# Patient Record
Sex: Male | Born: 1970 | Race: Black or African American | Hispanic: No | Marital: Married | Smoking: Never smoker
Health system: Southern US, Community
[De-identification: ages and names within clinical notes are randomized; demographics above are authoritative.]

## PROBLEM LIST (undated history)

## (undated) DIAGNOSIS — A048 Other specified bacterial intestinal infections: Secondary | ICD-10-CM

## (undated) DIAGNOSIS — M549 Dorsalgia, unspecified: Secondary | ICD-10-CM

## (undated) HISTORY — DX: Dorsalgia, unspecified: M54.9

---
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
## 2004-07-27 ENCOUNTER — Emergency Department (HOSPITAL_COMMUNITY): Admission: EM | Admit: 2004-07-27 | Discharge: 2004-07-28 | Payer: Self-pay | Admitting: Emergency Medicine

## 2006-04-30 ENCOUNTER — Emergency Department (HOSPITAL_COMMUNITY): Admission: EM | Admit: 2006-04-30 | Discharge: 2006-04-30 | Payer: Self-pay | Admitting: Family Medicine

## 2013-06-29 ENCOUNTER — Encounter (HOSPITAL_COMMUNITY): Payer: Self-pay | Admitting: Emergency Medicine

## 2013-06-29 ENCOUNTER — Emergency Department (HOSPITAL_COMMUNITY): Payer: Commercial Managed Care - PPO

## 2013-06-29 ENCOUNTER — Emergency Department (HOSPITAL_COMMUNITY)
Admission: EM | Admit: 2013-06-29 | Discharge: 2013-06-29 | Disposition: A | Discharge status: Home / Self Care | Payer: Commercial Managed Care - PPO | Attending: Emergency Medicine | Admitting: Emergency Medicine

## 2013-06-29 DIAGNOSIS — M25475 Effusion, left foot: Secondary | ICD-10-CM

## 2013-06-29 DIAGNOSIS — M25473 Effusion, unspecified ankle: Secondary | ICD-10-CM | POA: Insufficient documentation

## 2013-06-29 DIAGNOSIS — M25579 Pain in unspecified ankle and joints of unspecified foot: Secondary | ICD-10-CM | POA: Insufficient documentation

## 2013-06-29 DIAGNOSIS — M25476 Effusion, unspecified foot: Secondary | ICD-10-CM | POA: Insufficient documentation

## 2013-06-29 NOTE — ED Provider Notes (Signed)
CSN: [id number]     Arrival date & time 06/29/13  1432 History  This chart was scribed for Junious Silk, PA-C, working with Flint Melter, MD by Blanchard Kelch, ED Scribe. This patient was seen in room TR05C/TR05C and the patient's care was started at 3:15 PM.     Chief Complaint  Patient presents with  . Foot Pain    Patient is a 42 y.o. male presenting with lower extremity pain. The history is provided by the patient. No language interpreter was used.  Foot Pain Pertinent negatives include no chest pain and no shortness of breath.    HPI Comments: Donald Landry is a 43 y.o. male who presents to the Emergency Department complaining of constant left foot pain with associated swelling that began 9am this morning. The pain is worsened with walking. He does not remember any trauma to the area, but thinks he could have come down on his foot wrong due to the nature of his job. He used ibuprofen, ice and changed shoes today at work to alleviate the pain with moderate relief. He denies any calf pain, SOB, or chest pain. He denies recent travel, surgery, history of cancer, or history of DVT in the lungs or extremities.    History reviewed. No pertinent past medical history. History reviewed. No pertinent past surgical history. History reviewed. No pertinent family history. History  Substance Use Topics  . Smoking status: Never Smoker   . Smokeless tobacco: Not on file  . Alcohol Use: Yes    Review of Systems  Respiratory: Negative.  Negative for shortness of breath.   Cardiovascular: Negative for chest pain and leg swelling.  Musculoskeletal: Positive for arthralgias. Negative for myalgias.  All other systems reviewed and are negative.    Allergies  Review of patient's allergies indicates no known allergies.  Home Medications   Current Outpatient Rx  Name  Route  Sig  Dispense  Refill  . ibuprofen (ADVIL,MOTRIN) 200 MG tablet   Oral   Take 400 mg by mouth every 6 (six) hours  as needed.          Triage Vitals: BP 149/81  Pulse 75  Temp(Src) 97.9 F (36.6 C)  Resp 18  Wt 204 lb (92.534 kg)  SpO2 100%  Physical Exam  Nursing note and vitals reviewed. Constitutional: He is oriented to person, place, and time. He appears well-developed and well-nourished. No distress.  HENT:  Head: Normocephalic and atraumatic.  Right Ear: External ear normal.  Left Ear: External ear normal.  Nose: Nose normal.  Eyes: Conjunctivae are normal.  Neck: Normal range of motion. No tracheal deviation present.  Cardiovascular: Normal rate, regular rhythm, normal heart sounds, intact distal pulses and normal pulses.    Capillary refill 3 seconds in all toes. Strong posterior tibial and DP pulses. Homan's sign negative.   Pulmonary/Chest: Effort normal and breath sounds normal. No stridor.  Abdominal: Soft. He exhibits no distension. There is no tenderness.  Musculoskeletal: Normal range of motion. He exhibits edema and tenderness.       Feet:  Cool lower extremities bilaterally. Mild swelling on dorsal aspect of left foot. Compartment soft. Neurovascularly intact.  No calf tenderness.   Neurological: He is alert and oriented to person, place, and time.  Skin: Skin is warm and dry. He is not diaphoretic.  No erythema  Psychiatric: He has a normal mood and affect. His behavior is normal.    ED Course  Procedures (including critical care time)  DIAGNOSTIC STUDIES: Oxygen Saturation is 100% on room air, normal by my interpretation.    COORDINATION OF CARE: 3:47 PM - Patient verbalizes understanding and agrees with treatment plan.    Labs Review Labs Reviewed - No data to display Imaging Review Dg Foot Complete Left  06/29/2013   CLINICAL DATA:  Left foot pain.  EXAM: LEFT FOOT - COMPLETE 3+ VIEW  COMPARISON:  Ankle study 07/28/2004  FINDINGS: Three views of the left foot are negative for an acute fracture or dislocation. No gross soft tissue abnormality. Normal  alignment of the foot.  IMPRESSION: No acute bone abnormality.   Electronically Signed   By: Richarda Overlie M.D.   On: 06/29/2013 15:26    EKG Interpretation   None       MDM   1. Swelling of foot joint, left    Swelling of his left foot on the dorsal aspect. Pain has significantly improved. No concern for DVT. He is Wells criteria negative. No concern for gout. Discussed rest, ice, elevation, compression. Strong distal pulses. Neurovascularly intact. Compartment soft. Gave patient strict return instructions. Vital signs stable for discharge. Patient / Family / Caregiver informed of clinical course, understand medical decision-making process, and agree with plan.   I personally performed the services described in this documentation, which was scribed in my presence. The recorded information has been reviewed and is accurate.     Mora Bellman, PA-C 06/29/13 1556

## 2013-06-29 NOTE — ED Notes (Signed)
Per pt sts he is having left foot pain and swelling. Denies injury. sts possibly shoes he has been wearing.

## 2013-06-30 NOTE — ED Provider Notes (Signed)
Medical screening examination/treatment/procedure(s) were performed by non-physician practitioner and as supervising physician I was immediately available for consultation/collaboration.  Flint Melter, MD 06/30/13 [phone]

## 2014-05-28 ENCOUNTER — Emergency Department (HOSPITAL_COMMUNITY)
Admission: EM | Admit: 2014-05-28 | Discharge: 2014-05-28 | Disposition: A | Discharge status: Home / Self Care | Payer: Commercial Managed Care - PPO | Attending: Emergency Medicine | Admitting: Emergency Medicine

## 2014-05-28 ENCOUNTER — Encounter (HOSPITAL_COMMUNITY): Payer: Self-pay | Admitting: Emergency Medicine

## 2014-05-28 DIAGNOSIS — Z23 Encounter for immunization: Secondary | ICD-10-CM | POA: Diagnosis not present

## 2014-05-28 DIAGNOSIS — S61412A Laceration without foreign body of left hand, initial encounter: Secondary | ICD-10-CM

## 2014-05-28 DIAGNOSIS — Y929 Unspecified place or not applicable: Secondary | ICD-10-CM | POA: Diagnosis not present

## 2014-05-28 DIAGNOSIS — W208XXA Other cause of strike by thrown, projected or falling object, initial encounter: Secondary | ICD-10-CM | POA: Insufficient documentation

## 2014-05-28 DIAGNOSIS — S61402A Unspecified open wound of left hand, initial encounter: Secondary | ICD-10-CM | POA: Insufficient documentation

## 2014-05-28 DIAGNOSIS — Y9389 Activity, other specified: Secondary | ICD-10-CM | POA: Insufficient documentation

## 2014-05-28 MED ORDER — TETANUS-DIPHTH-ACELL PERTUSSIS 5-2.5-18.5 LF-MCG/0.5 IM SUSP
0.5000 mL | Freq: Once | INTRAMUSCULAR | Status: AC
  Administered 2014-05-28: 0.5 mL via INTRAMUSCULAR
  Filled 2014-05-28: qty 0.5

## 2014-05-28 MED ORDER — LIDOCAINE HCL (PF) 1 % IJ SOLN
2.0000 mL | Freq: Once | INTRAMUSCULAR | Status: AC
  Administered 2014-05-28: 2 mL
  Filled 2014-05-28: qty 5

## 2014-05-28 NOTE — ED Provider Notes (Signed)
CSN: [id number]     Arrival date & time 05/28/14  1414 History  This chart was scribed for non-physician practitioner, Johnnette Gourdobyn Albert, PA-C,working with Flint MelterElliott L Wentz, MD, by Karle PlumberJennifer Tensley, ED Scribe. This patient was seen in room TR06C/TR06C and the patient's care was started at 2:41 PM.  Chief Complaint  Patient presents with  . Laceration   Patient is a 43 y.o. male presenting with skin laceration. The history is provided by the patient. No language interpreter was used.  Laceration  HPI Comments:  Donald Landry is a 43 y.o. male who presents to the Emergency Department complaining of a laceration to the dorsal aspect of his left hand that occurred PTA. Pt states the metal frame of a light fixture fell onto his hand while he was trying to take it down. He reports associated bleeding that is now controlled. He has not done anything for the wound since the incident. He denies numbness, tingling or inability to move the finger. He reports his last tetanus vaccination was over ten years ago.   History reviewed. No pertinent past medical history. History reviewed. No pertinent past surgical history. History reviewed. No pertinent family history. History  Substance Use Topics  . Smoking status: Never Smoker   . Smokeless tobacco: Not on file  . Alcohol Use: Yes    Review of Systems  Skin: Positive for wound.  Neurological: Negative for numbness.  All other systems reviewed and are negative.   Allergies  Review of patient's allergies indicates no known allergies.  Home Medications   Prior to Admission medications   Medication Sig Start Date End Date Taking? Authorizing Provider  ibuprofen (ADVIL,MOTRIN) 200 MG tablet Take 400 mg by mouth every 6 (six) hours as needed.    Historical Provider, MD   Triage Vitals: BP 142/71  Pulse 68  Resp 20  SpO2 100% Physical Exam  Nursing note and vitals reviewed. Constitutional: He is oriented to person, place, and time. He appears  well-developed and well-nourished. No distress.  HENT:  Head: Normocephalic and atraumatic.  Eyes: Conjunctivae and EOM are normal.  Neck: Normal range of motion. Neck supple.  Cardiovascular: Normal rate, regular rhythm and normal heart sounds.   Cap refill less than 3 seconds.  Pulmonary/Chest: Effort normal and breath sounds normal.  Musculoskeletal: Normal range of motion. He exhibits no edema.  Neurological: He is alert and oriented to person, place, and time.  Skin: Skin is warm and dry.  2 cm laceration over dorsal aspect of right hand over MCP joint. Full flexion and extension at MCP joint. No erythema or swelling noted.  Psychiatric: He has a normal mood and affect. His behavior is normal.    ED Course  Procedures (including critical care time) DIAGNOSTIC STUDIES: Oxygen Saturation is 100% on RA, normal by my interpretation.   COORDINATION OF CARE: 2:45 PM- Will irrigate wound and suture it. Pt verbalizes understanding and agrees to plan.  LACERATION REPAIR PROCEDURE NOTE The patient's identification was confirmed and consent was obtained. This procedure was performed by Johnnette Gourdobyn Albert, PA-C at 2:57 PM. Site: left dorsal hand Sterile procedures observed Anesthetic used (type and amt): Lidocaine 1% without Epinephrine (2 mLs) Suture type/size: 5-0 Prolene Length: 2 cm # of Sutures: 7 Technique: simple, interrupted Complexity: simple Antibx ointment applied Tetanus ordered Site anesthetized, irrigated with NS, explored without evidence of foreign body, wound well approximated, site covered with dry, sterile dressing.  Patient tolerated procedure well without complications. Instructions for care discussed verbally  and patient provided with additional written instructions for homecare and f/u.  Medications  lidocaine (PF) (XYLOCAINE) 1 % injection 2 mL (2 mLs Infiltration Given 05/28/14 1455)  Tdap (BOOSTRIX) injection 0.5 mL (0.5 mLs Intramuscular Given 05/28/14 1455)     Labs Review Labs Reviewed - No data to display  Imaging Review No results found.   EKG Interpretation None      MDM   Final diagnoses:  Hand laceration, left, initial encounter   Neurovascularly intact no evidence of tendon disruption. Wound care given. Laceration sutured. Tdap updated. Stable for d/c. Return precautions given. Patient states understanding of treatment care plan and is agreeable.  I personally performed the services described in this documentation, which was scribed in my presence. The recorded information has been reviewed and is accurate.    Trevor Mace, PA-C 05/28/14 1530

## 2014-05-28 NOTE — Discharge Instructions (Signed)
Laceration Care, Adult °A laceration is a cut or lesion that goes through all layers of the skin and into the tissue just beneath the skin. °TREATMENT  °Some lacerations may not require closure. Some lacerations may not be able to be closed due to an increased risk of infection. It is important to see your caregiver as soon as possible after an injury to minimize the risk of infection and maximize the opportunity for successful closure. °If closure is appropriate, pain medicines may be given, if needed. The wound will be cleaned to help prevent infection. Your caregiver will use stitches (sutures), staples, wound glue (adhesive), or skin adhesive strips to repair the laceration. These tools bring the skin edges together to allow for faster healing and a better cosmetic outcome. However, all wounds will heal with a scar. Once the wound has healed, scarring can be minimized by covering the wound with sunscreen during the day for 1 full year. °HOME CARE INSTRUCTIONS  °For sutures or staples: °· Keep the wound clean and dry. °· If you were given a bandage (dressing), you should change it at least once a day. Also, change the dressing if it becomes wet or dirty, or as directed by your caregiver. °· Wash the wound with soap and water 2 times a day. Rinse the wound off with water to remove all soap. Pat the wound dry with a clean towel. °· After cleaning, apply a thin layer of the antibiotic ointment as recommended by your caregiver. This will help prevent infection and keep the dressing from sticking. °· You may shower as usual after the first 24 hours. Do not soak the wound in water until the sutures are removed. °· Only take over-the-counter or prescription medicines for pain, discomfort, or fever as directed by your caregiver. °· Get your sutures or staples removed as directed by your caregiver. °For skin adhesive strips: °· Keep the wound clean and dry. °· Do not get the skin adhesive strips wet. You may bathe  carefully, using caution to keep the wound dry. °· If the wound gets wet, pat it dry with a clean towel. °· Skin adhesive strips will fall off on their own. You may trim the strips as the wound heals. Do not remove skin adhesive strips that are still stuck to the wound. They will fall off in time. °For wound adhesive: °· You may briefly wet your wound in the shower or bath. Do not soak or scrub the wound. Do not swim. Avoid periods of heavy perspiration until the skin adhesive has fallen off on its own. After showering or bathing, gently pat the wound dry with a clean towel. °· Do not apply liquid medicine, cream medicine, or ointment medicine to your wound while the skin adhesive is in place. This may loosen the film before your wound is healed. °· If a dressing is placed over the wound, be careful not to apply tape directly over the skin adhesive. This may cause the adhesive to be pulled off before the wound is healed. °· Avoid prolonged exposure to sunlight or tanning lamps while the skin adhesive is in place. Exposure to ultraviolet light in the first year will darken the scar. °· The skin adhesive will usually remain in place for 5 to 10 days, then naturally fall off the skin. Do not pick at the adhesive film. °You may need a tetanus shot if: °· You cannot remember when you had your last tetanus shot. °· You have never had a tetanus   shot. °If you get a tetanus shot, your arm may swell, get red, and feel warm to the touch. This is common and not a problem. If you need a tetanus shot and you choose not to have one, there is a rare chance of getting tetanus. Sickness from tetanus can be serious. °SEEK MEDICAL CARE IF:  °· You have redness, swelling, or increasing pain in the wound. °· You see a red line that goes away from the wound. °· You have yellowish-white fluid (pus) coming from the wound. °· You have a fever. °· You notice a bad smell coming from the wound or dressing. °· Your wound breaks open before or  after sutures have been removed. °· You notice something coming out of the wound such as wood or glass. °· Your wound is on your hand or foot and you cannot move a finger or toe. °SEEK IMMEDIATE MEDICAL CARE IF:  °· Your pain is not controlled with prescribed medicine. °· You have severe swelling around the wound causing pain and numbness or a change in color in your arm, hand, leg, or foot. °· Your wound splits open and starts bleeding. °· You have worsening numbness, weakness, or loss of function of any joint around or beyond the wound. °· You develop painful lumps near the wound or on the skin anywhere on your body. °MAKE SURE YOU:  °· Understand these instructions. °· Will watch your condition. °· Will get help right away if you are not doing well or get worse. °Document Released: 08/11/2005 Document Revised: 11/03/2011 Document Reviewed: 02/04/2011 °[employer]® Patient Information ©2015 [employer]. This information is not intended to replace advice given to you by your health care provider. Make sure you discuss any questions you have with your health care provider. ° °Sutured Wound Care °Sutures are stitches that can be used to close wounds. Wound care helps prevent pain and infection.  °HOME CARE INSTRUCTIONS  °· Rest and elevate the injured area until all the pain and swelling are gone. °· Only take over-the-counter or prescription medicines for pain, discomfort, or fever as directed by your caregiver. °· After 48 hours, gently wash the area with mild soap and water once a day, or as directed. Rinse off the soap. Pat the area dry with a clean towel. Do not rub the wound. This may cause bleeding. °· Follow your caregiver's instructions for how often to change the bandage (dressing). Stop using a dressing after 2 days or after the wound stops draining. °· If the dressing sticks, moisten it with soapy water and gently remove it. °· Apply ointment on the wound as directed. °· Avoid stretching a sutured  wound. °· Drink enough fluids to keep your urine clear or pale yellow. °· Follow up with your caregiver for suture removal as directed. °· Use sunscreen on your wound for the next 3 to 6 months so the scar will not darken. °SEEK IMMEDIATE MEDICAL CARE IF:  °· Your wound becomes red, swollen, hot, or tender. °· You have increasing pain in the wound. °· You have a red streak that extends from the wound. °· There is pus coming from the wound. °· You have a fever. °· You have shaking chills. °· There is a bad smell coming from the wound. °· You have persistent bleeding from the wound. °MAKE SURE YOU:  °· Understand these instructions. °· Will watch your condition. °· Will get help right away if you are not doing well or get worse. °Document Released:   09/18/2004 Document Revised: 11/03/2011 Document Reviewed: 12/15/2010 °ExitCare® Patient Information ©2015 [employer]. This information is not intended to replace advice given to you by your health care provider. Make sure you discuss any questions you have with your health care provider. ° °

## 2014-05-28 NOTE — ED Notes (Signed)
Per pt sts left hand lac from an 8 foot  light. sts happened one hour ago. Bleeding controlled. Pt can move and feel fingers without difficulty.

## 2014-05-28 NOTE — ED Notes (Signed)
Declined W/C at D/C and was escorted to lobby by RN. 

## 2014-05-30 NOTE — ED Provider Notes (Signed)
Medical screening examination/treatment/procedure(s) were performed by non-physician practitioner and as supervising physician I was immediately available for consultation/collaboration.  Flint MelterElliott L Jasslyn Finkel, MD 05/30/14 [phone]

## 2015-05-23 ENCOUNTER — Emergency Department (HOSPITAL_COMMUNITY): Payer: Commercial Managed Care - PPO

## 2015-05-23 ENCOUNTER — Emergency Department (HOSPITAL_COMMUNITY)
Admission: EM | Admit: 2015-05-23 | Discharge: 2015-05-23 | Disposition: A | Discharge status: Home / Self Care | Payer: Commercial Managed Care - PPO | Attending: Emergency Medicine | Admitting: Emergency Medicine

## 2015-05-23 ENCOUNTER — Encounter (HOSPITAL_COMMUNITY): Payer: Self-pay | Admitting: Emergency Medicine

## 2015-05-23 DIAGNOSIS — S41101A Unspecified open wound of right upper arm, initial encounter: Secondary | ICD-10-CM | POA: Diagnosis present

## 2015-05-23 DIAGNOSIS — S51811A Laceration without foreign body of right forearm, initial encounter: Secondary | ICD-10-CM | POA: Diagnosis not present

## 2015-05-23 DIAGNOSIS — S41151A Open bite of right upper arm, initial encounter: Secondary | ICD-10-CM

## 2015-05-23 DIAGNOSIS — Y9389 Activity, other specified: Secondary | ICD-10-CM | POA: Insufficient documentation

## 2015-05-23 DIAGNOSIS — Y9289 Other specified places as the place of occurrence of the external cause: Secondary | ICD-10-CM | POA: Insufficient documentation

## 2015-05-23 DIAGNOSIS — Y998 Other external cause status: Secondary | ICD-10-CM | POA: Diagnosis not present

## 2015-05-23 DIAGNOSIS — W540XXA Bitten by dog, initial encounter: Secondary | ICD-10-CM | POA: Diagnosis not present

## 2015-05-23 MED ORDER — AMOXICILLIN-POT CLAVULANATE 875-125 MG PO TABS
1.0000 | ORAL_TABLET | Freq: Once | ORAL | Status: AC
  Administered 2015-05-23: 1 via ORAL
  Filled 2015-05-23: qty 1

## 2015-05-23 MED ORDER — AMOXICILLIN-POT CLAVULANATE 875-125 MG PO TABS: 1.0000 | ORAL_TABLET | Freq: Two times a day (BID) | ORAL | Status: DC

## 2015-05-23 MED ORDER — LIDOCAINE-PRILOCAINE 2.5-2.5 % EX CREA: TOPICAL_CREAM | Freq: Once | CUTANEOUS | Status: DC

## 2015-05-23 MED ORDER — LIDOCAINE HCL (PF) 1 % IJ SOLN
30.0000 mL | Freq: Once | INTRAMUSCULAR | Status: AC
  Administered 2015-05-23: 30 mL
  Filled 2015-05-23: qty 30

## 2015-05-23 NOTE — Discharge Instructions (Signed)

## 2015-05-23 NOTE — ED Notes (Signed)
Pt reports he was bit by [place] bull dog just pta. Pt with large puncture wound/laceration and 2 smaller puncture wounds to R FA. Bleeding controlled. Dog is up to date on rabies vaccine.

## 2015-05-23 NOTE — ED Provider Notes (Signed)
CSN: [id number]     Arrival date & time 05/23/15  2037 History  By signing my name below, I, Octavia Heir, attest that this documentation has been prepared under the direction and in the presence of Marlon Pel, PA-C. Electronically Signed: Octavia Heir, ED Scribe. 05/23/2015. 9:10 PM.    Chief Complaint  Patient presents with  . Animal Bite      The history is provided by the patient. No language interpreter was used.   HPI Comments: Donald Landry is a 44 y.o. male who presents to the Emergency Department complaining of a sudden onset, gradual worsening animal bit onset 30 minutes PTA. He rates his pain as a current 3/10. Pt has a laceration on the inside of his right forearm and two small puncture wounds on the side. He states the dog clamped down on his arm and then released. Pt is up to date on his tetanus shot. The animal that bit him is up to date on his rabies vaccination, owner of the dog is present. It was a pitbull.  History reviewed. No pertinent past medical history. History reviewed. No pertinent past surgical history. No family history on file. Social History  Substance Use Topics  . Smoking status: Never Smoker   . Smokeless tobacco: None  . Alcohol Use: Yes    Review of Systems  Skin: Positive for wound.  All other systems reviewed and are negative.   Allergies  Review of patient's allergies indicates no known allergies.  Home Medications   Prior to Admission medications   Medication Sig Start Date End Date Taking? Authorizing Provider  amoxicillin-clavulanate (AUGMENTIN) 875-125 MG tablet Take 1 tablet by mouth 2 (two) times daily. 05/23/15   Tiffany Neva Seat, PA-C  ibuprofen (ADVIL,MOTRIN) 200 MG tablet Take 400 mg by mouth every 6 (six) hours as needed.    Historical Provider, MD   Triage vitals: BP 135/78 mmHg  Pulse 110  Temp(Src) 98.6 F (37 C) (Oral)  Resp 18  Ht  (1.676 m)  Wt 218 lb (98.884 kg)  BMI 35.20 kg/m2  SpO2 97% Physical  Exam  Constitutional: He appears well-developed and well-nourished. No distress.  HENT:  Head: Normocephalic and atraumatic.  Eyes: Right eye exhibits no discharge. Left eye exhibits no discharge.  Pulmonary/Chest: Effort normal. No respiratory distress.  Neurological: He is alert. Coordination normal.  Skin: No rash noted. He is not diaphoretic.  Large 3 cm laceration to the anterior portion of the forearm with multiple superficial abrasions and 1 associated puncture wound.  The large laceration is open with fatty tissue exposed. The wound on exploration goes deeper through the subcutaneous fat but does not extend into the musculature.   He has FROM of his elbow, wrist, and all 5 fingers. Intact sensations, not actively bleeding.   Psychiatric: He has a normal mood and affect. His behavior is normal.  Nursing note and vitals reviewed.   ED Course  Procedures  DIAGNOSTIC STUDIES: Oxygen Saturation is 97% on RA, normal by my interpretation.  COORDINATION OF CARE:  9:07 PM Discussed treatment plan which includes x-ray of right forearm, suture area with pt at bedside and pt agreed to plan.  Labs Review Labs Reviewed - No data to display  Imaging Review Dg Forearm Right  05/23/2015   CLINICAL DATA:  Right forearm dog bite.  Initial encounter.  EXAM: RIGHT FOREARM - 2 VIEW  COMPARISON:  None.  FINDINGS: Swelling and skin breech to the ventral proximal forearm. No fracture, dislocation,  or opaque foreign body.  IMPRESSION: Soft tissue injury without osseous abnormality. No opaque foreign body.   Electronically Signed   By: Marnee Spring M.D.   On: 05/23/2015 21:33   I have personally reviewed and evaluated these images and lab results as part of my medical decision-making.   EKG Interpretation None      MDM   Final diagnoses:  Dog bite of arm, right, initial encounter    LACERATION REPAIR Performed by: Dorthula Matas Authorized by: Dorthula Matas Consent: Verbal  consent obtained. Risks and benefits: risks, benefits and alternatives were discussed Consent given by: patient Patient identity confirmed: provided demographic data Prepped and Draped in normal sterile fashion Wound explored Laceration Location: right forearm Laceration Length:  3 cm No Foreign Bodies seen or palpated Anesthesia: local infiltration Local anesthetic: lidocaine 1 % wo epinephrine Anesthetic total: 4 ml Irrigation method: syringe Amount of cleaning: standard Skin closure: sutures Number of sutures: 3 Technique: 3 cm laceration wound edges were loosely approximated. No subcutaneous fat exposed, no bleeding Patient tolerance: Patient tolerated the procedure well with no immediate complications.   Patients wound was numbed and thoroughly irrigated with a L of NS. Due to large gaping wound the 3 cm laceration was then loosely approximated. Pt UTD on tetanus, dog has had rabies vaccination and the owner of the dog is present.  Patient advised to return in 2-3 days for wound recheck and educated on risk of infection despite all measures taken for prevention.  Sutures will most likely s tay in for 10-14 days. Wound education given.  Medications  amoxicillin-clavulanate (AUGMENTIN) 875-125 MG per tablet 1 tablet (not administered)  lidocaine (PF) (XYLOCAINE) 1 % injection 30 mL (30 mLs Other Given 05/23/15 2144)    43 y.o.Donald Landry's evaluation in the Emergency Department is complete. It has been determined that no acute conditions requiring further emergency intervention are present at this time. The patient/guardian have been advised of the diagnosis and plan. We have discussed signs and symptoms that warrant return to the ED, such as changes or worsening in symptoms.  Vital signs are stable at discharge. Filed Vitals:   05/23/15 2042  BP: 135/78  Pulse: 110  Temp: 98.6 F (37 C)  Resp: 18    Patient/guardian has voiced understanding and agreed to follow-up  with the PCP or specialist.  I personally performed the services described in this documentation, which was scribed in my presence. The recorded information has been reviewed and is accurate.       Marlon Pel, PA-C 05/23/15 2228  Lavera Guise, MD 05/24/15 [phone]

## 2016-08-29 IMAGING — DX DG FOREARM 2V*R*
2 series · 2 of 2 positions shown · non-contrast
Comparison: None.

CLINICAL DATA: Right forearm dog bite.  Initial encounter.

EXAM:
RIGHT FOREARM - 2 VIEW

[forearm ap]
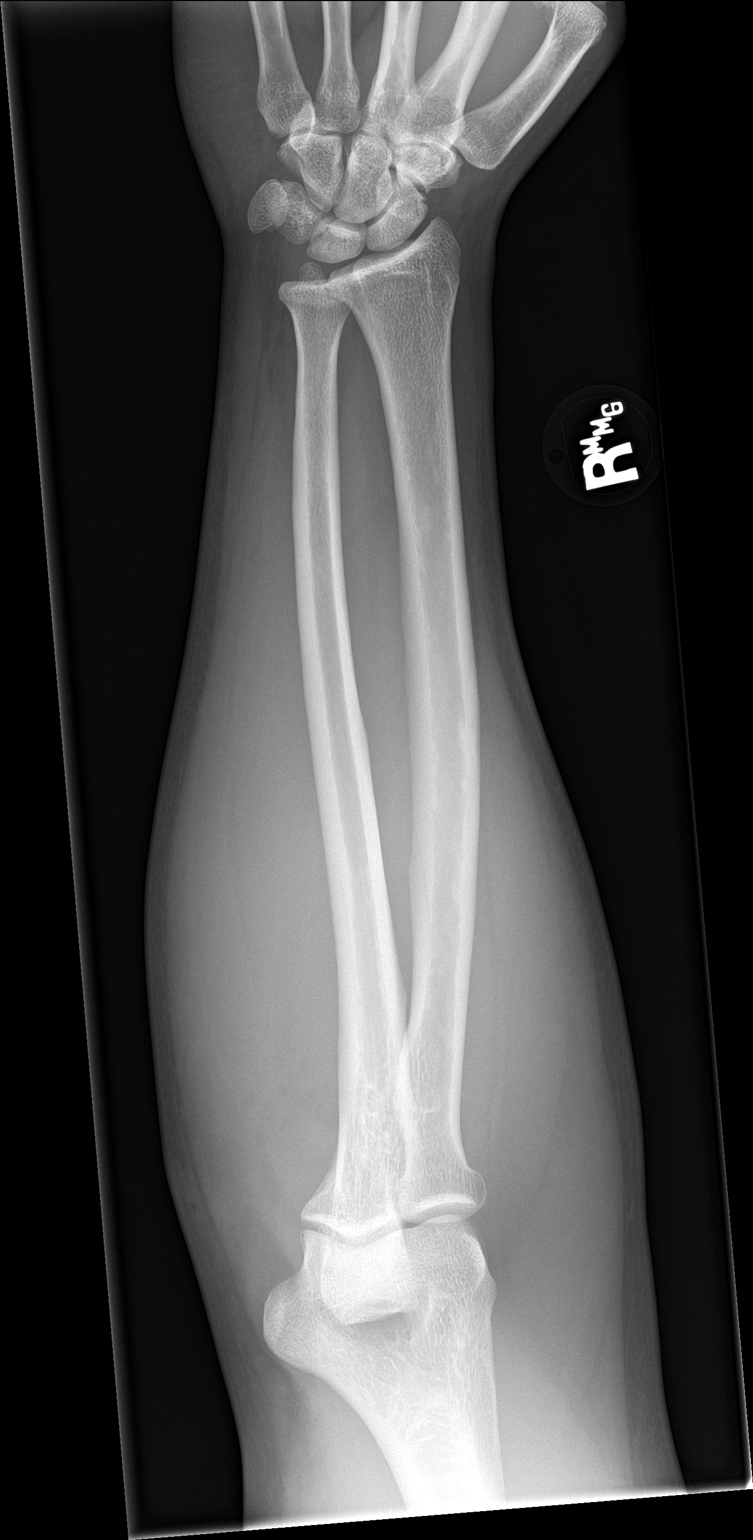

[forearm lat]
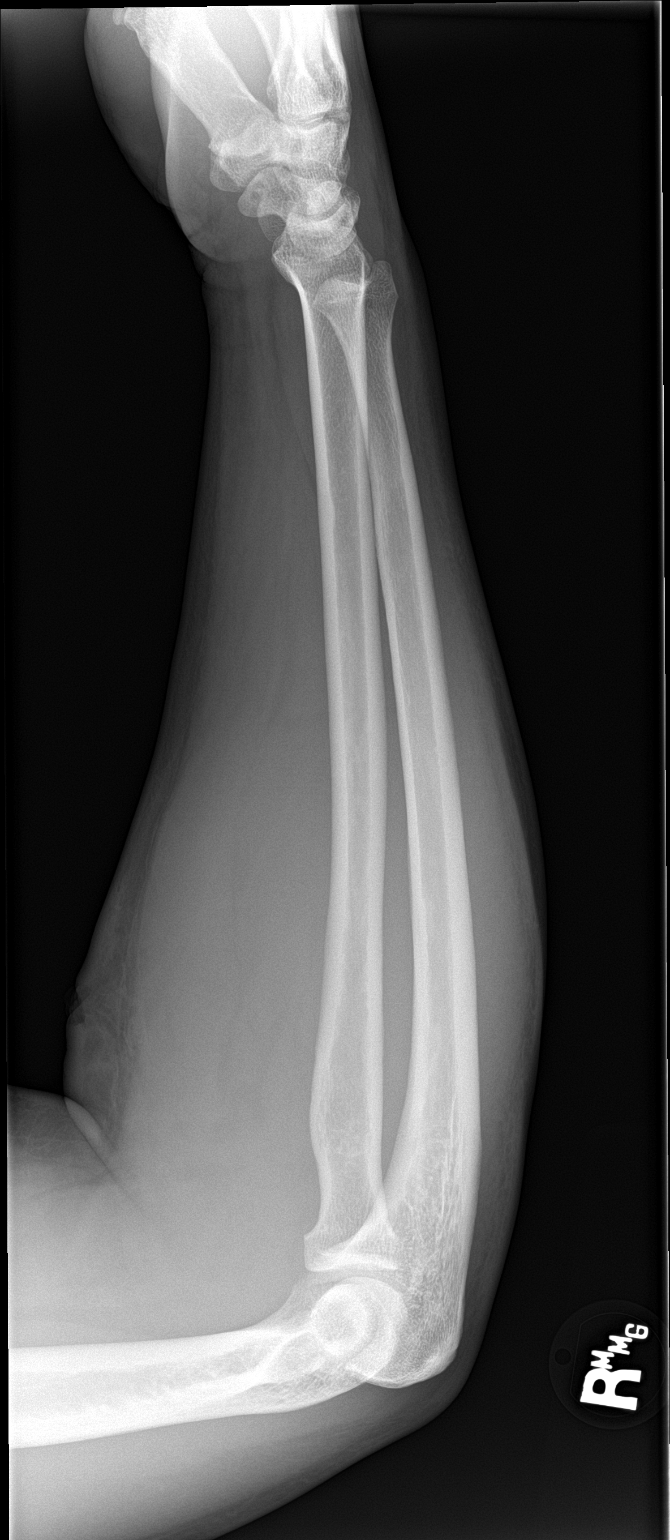

[2 of 2 positions shown; findings below may reference images not displayed]

FINDINGS: Swelling and skin breech to the ventral proximal forearm. No
fracture, dislocation, or opaque foreign body.
IMPRESSION: Soft tissue injury without osseous abnormality. No opaque foreign
body.

## 2018-03-24 ENCOUNTER — Emergency Department (HOSPITAL_COMMUNITY)
Admission: EM | Admit: 2018-03-24 | Discharge: 2018-03-25 | Disposition: A | Discharge status: Home / Self Care | Payer: Commercial Managed Care - PPO | Attending: Emergency Medicine | Admitting: Emergency Medicine

## 2018-03-24 ENCOUNTER — Other Ambulatory Visit: Payer: Self-pay

## 2018-03-24 ENCOUNTER — Encounter (HOSPITAL_COMMUNITY): Payer: Self-pay | Admitting: Emergency Medicine

## 2018-03-24 DIAGNOSIS — L03032 Cellulitis of left toe: Secondary | ICD-10-CM | POA: Insufficient documentation

## 2018-03-24 NOTE — ED Triage Notes (Signed)
Pt complains of L toe swelling and ingrown toenail has been going on since last Thursday.

## 2018-03-25 MED ORDER — OXYCODONE-ACETAMINOPHEN 5-325 MG PO TABS
ORAL_TABLET | ORAL | Status: AC
  Filled 2018-03-25: qty 1

## 2018-03-25 MED ORDER — LIDOCAINE HCL 2 % IJ SOLN
20.0000 mL | Freq: Once | INTRAMUSCULAR | Status: AC
  Administered 2018-03-25: 400 mg
  Filled 2018-03-25: qty 20

## 2018-03-25 MED ORDER — OXYCODONE-ACETAMINOPHEN 5-325 MG PO TABS
1.0000 | ORAL_TABLET | ORAL | Status: DC | PRN
  Administered 2018-03-25: 1 via ORAL

## 2018-03-25 MED ORDER — DOXYCYCLINE HYCLATE 100 MG PO CAPS: 100.0000 mg | ORAL_CAPSULE | Freq: Two times a day (BID) | ORAL | 0 refills | Status: DC

## 2018-03-25 NOTE — ED Notes (Signed)
Patient verbalizes understanding of medications and discharge instructions. No further questions at this time. VSS and patient ambulatory at discharge.   

## 2018-03-25 NOTE — ED Provider Notes (Signed)
[name] EMERGENCY DEPARTMENT Provider Note   CSN: [id number] Arrival date & time: 03/24/18  2335     History   Chief Complaint Chief Complaint  Patient presents with  . Ingrown Toenail    HPI Donald Landry is a 47 y.o. male.  Left great toe pain and swelling for the past 2 days.  Denies injury.  Denies fever.  No bleeding or drainage.  He is not a diabetic.  He is never had this problem before.  The history is provided by the patient.    History reviewed. No pertinent past medical history.  There are no active problems to display for this patient.   History reviewed. No pertinent surgical history.      Home Medications    Prior to Admission medications   Medication Sig Start Date End Date Taking? Authorizing Provider  amoxicillin-clavulanate (AUGMENTIN) 875-125 MG tablet Take 1 tablet by mouth 2 (two) times daily. 05/23/15   Marlon Pel, PA-C  ibuprofen (ADVIL,MOTRIN) 200 MG tablet Take 400 mg by mouth every 6 (six) hours as needed.    [provider]    Family History No family history on file.  Social History Social History   Tobacco Use  . Smoking status: Never Smoker  Substance Use Topics  . Alcohol use: Yes  . Drug use: Not on file     Allergies   Patient has no known allergies.   Review of Systems Review of Systems  Musculoskeletal: Positive for arthralgias and myalgias.    all other systems are negative except as noted in the HPI and PMH.    Physical Exam Updated Vital Signs BP 123/72 (BP Location: Right Arm)   Pulse (!) 55   Temp 98.2 F (36.8 C) (Oral)   Resp 16   Ht 5\' 6"  (1.676 m)   Wt 99.3 kg (219 lb)   SpO2 100%   BMI 35.35 kg/m   Physical Exam  Constitutional: He is oriented to person, place, and time. He appears well-developed and well-nourished. No distress.  HENT:  Head: Normocephalic and atraumatic.  Mouth/Throat: Oropharynx is clear and moist. No oropharyngeal exudate.  Eyes:  Pupils are equal, round, and reactive to light. Conjunctivae and EOM are normal.  Neck: Normal range of motion. Neck supple.  No meningismus.  Cardiovascular: Normal rate, regular rhythm, normal heart sounds and intact distal pulses.  No murmur heard. Pulmonary/Chest: Effort normal and breath sounds normal. No respiratory distress.  Abdominal: Soft. There is no tenderness. There is no rebound and no guarding.  Musculoskeletal: Normal range of motion. He exhibits edema and tenderness.  Left great toe with tenderness and fluctuance along nail fold.  Consistent with paronychia.  Intact DP and PT pulses.  Neurological: He is alert and oriented to person, place, and time. No cranial nerve deficit. He exhibits normal muscle tone. Coordination normal.  No ataxia on finger to nose bilaterally. No pronator drift. 5/5 strength throughout. CN 2-12 intact.Equal grip strength. Sensation intact.   Skin: Skin is warm.  Psychiatric: He has a normal mood and affect. His behavior is normal.  Nursing note and vitals reviewed.    ED Treatments / Results  Labs (all labs ordered are listed, but only abnormal results are displayed) Labs Reviewed - No data to display  EKG None  Radiology No results found.  Procedures .Marland KitchenIncision and Drainage Date/Time: 03/25/2018 4:34 AM Performed by: Glynn Octave, MD Authorized by: Glynn Octave, MD   Consent:    Consent obtained:  Verbal   Consent given by:  Patient   Risks discussed:  Bleeding, incomplete drainage and infection   Alternatives discussed:  No treatment Location:    Type:  Fluid collection   Location:  Lower extremity   Lower extremity location:  Toe   Toe location:  L big toe Pre-procedure details:    Skin preparation:  Betadine Anesthesia (see MAR for exact dosages):    Anesthesia method:  Nerve block   Block needle gauge:  25 G   Block anesthetic:  Lidocaine 1% w/o epi   Block technique:  Digital   Block injection procedure:   Anatomic landmarks identified, introduced needle, incremental injection, negative aspiration for blood and anatomic landmarks palpated   Block outcome:  Incomplete block Procedure details:    Needle aspiration: no     Incision types:  Single straight   Incision depth:  Subcutaneous   Scalpel blade:  11   Wound management:  Probed and deloculated and irrigated with saline   Drainage:  Purulent   Drainage amount:  Copious   Wound treatment:  Wound left open   Packing materials:  None Post-procedure details:    Patient tolerance of procedure:  Tolerated well, no immediate complications   (including critical care time)  Medications Ordered in ED Medications  oxyCODONE-acetaminophen (PERCOCET/ROXICET) 5-325 MG per tablet 1 tablet (1 tablet Oral Given 03/25/18 0131)  oxyCODONE-acetaminophen (PERCOCET/ROXICET) 5-325 MG per tablet (has no administration in time range)  lidocaine (XYLOCAINE) 2 % (with pres) injection 400 mg (has no administration in time range)     Initial Impression / Assessment and Plan / ED Course  I have reviewed the triage vital signs and the nursing notes.  Pertinent labs & imaging results that were available during my care of the patient were reviewed by me and considered in my medical decision making (see chart for details).    Left great toe paronychia.  Possible ingrown toenail as well.  Incision and drainage performed as above.  Patient will be given prophylactic antibiotics, instructed on wound care and warm soaks and referred to podiatry. Return precautions discussed.  Final Clinical Impressions(s) / ED Diagnoses   Final diagnoses:  Paronychia of great toe, left    ED Discharge Orders    None       Laurena Valko, Jeannett SeniorStephen, MD 03/25/18 [phone]

## 2018-03-25 NOTE — Discharge Instructions (Addendum)
Take the antibiotics and perform the warm soaks as described.  Follow-up with Dr. Logan BoresEvans in the foot center.  Return to the ED if develop worsening symptoms.

## 2019-01-24 ENCOUNTER — Telehealth: Payer: Self-pay

## 2019-01-24 NOTE — Telephone Encounter (Signed)
Left a vm for patient to callback and due the screening

## 2019-01-24 NOTE — Telephone Encounter (Signed)
Patient was negative for the cov-19 screening. Patient will be coming in office

## 2019-01-25 ENCOUNTER — Other Ambulatory Visit: Payer: Self-pay

## 2019-01-25 ENCOUNTER — Ambulatory Visit (INDEPENDENT_AMBULATORY_CARE_PROVIDER_SITE_OTHER): Payer: Self-pay | Admitting: Family Medicine

## 2019-01-25 ENCOUNTER — Encounter: Payer: Self-pay | Admitting: Family Medicine

## 2019-01-25 VITALS — BP 130/80 | HR 78 | Temp 97.6°F | Ht 66.0 in | Wt 192.0 lb

## 2019-01-25 DIAGNOSIS — Z0000 Encounter for general adult medical examination without abnormal findings: Secondary | ICD-10-CM

## 2019-01-25 DIAGNOSIS — Z7689 Persons encountering health services in other specified circumstances: Secondary | ICD-10-CM

## 2019-01-25 DIAGNOSIS — K4190 Unilateral femoral hernia, without obstruction or gangrene, not specified as recurrent: Secondary | ICD-10-CM

## 2019-01-25 DIAGNOSIS — R1031 Right lower quadrant pain: Secondary | ICD-10-CM

## 2019-01-25 DIAGNOSIS — Z09 Encounter for follow-up examination after completed treatment for conditions other than malignant neoplasm: Secondary | ICD-10-CM

## 2019-01-25 DIAGNOSIS — R8290 Unspecified abnormal findings in urine: Secondary | ICD-10-CM

## 2019-01-25 DIAGNOSIS — Z131 Encounter for screening for diabetes mellitus: Secondary | ICD-10-CM

## 2019-01-25 LAB — POCT GLYCOSYLATED HEMOGLOBIN (HGB A1C): Hemoglobin A1C: 5.1 % (ref 4.0–5.6)

## 2019-01-25 LAB — POCT URINALYSIS DIP (MANUAL ENTRY)
Bilirubin, UA: NEGATIVE
Glucose, UA: NEGATIVE mg/dL
Ketones, POC UA: NEGATIVE mg/dL
Leukocytes, UA: NEGATIVE
Nitrite, UA: NEGATIVE
Protein Ur, POC: NEGATIVE mg/dL
Spec Grav, UA: 1.025 (ref 1.010–1.025)
Urobilinogen, UA: 1 E.U./dL
pH, UA: 6.5 (ref 5.0–8.0)

## 2019-01-25 NOTE — Patient Instructions (Addendum)
Femoral Hernia, Adult  Having a femoral hernia means that fat or part of the intestine has pushed through a weak area between muscles into an opening in the lower groin (femoral canal). A femoral hernia may be present at birth, but it may not cause symptoms until you are an adult. You may also develop a femoral hernia as you get older. A femoral hernia tends to get worse over time. If it is not treated, it will not go away. There are several types of femoral hernias. You may have:  A hernia that comes and goes (reducible hernia). You may be able to see it only when you strain, lift something heavy, or cough. This type of hernia can be pushed back into the abdomen (reduced).  A hernia that traps abdominal tissue inside the hernia (incarcerated hernia). This type of hernia cannot be reduced.  A hernia that cuts off blood flow to the tissues inside the hernia (strangulated hernia). Without blood supply, these tissues can start to die. This type of hernia requires emergency treatment. What are the causes? The cause is usually not known. This condition can be triggered by:  Coughing.  Suddenly straining the muscles of the abdomen.  Lifting heavy objects.  Straining to have a bowel movement. Constipation can lead to a femoral hernia. What increases the risk? You have a greater risk for a femoral hernia if you:  Are male.  Frequently lift heavy objects.  Smoke or have lung disease.  Are often constipated.  Strain to pass urine.  Are overweight. What are the signs or symptoms? In many cases, a femoral hernia does not cause symptoms. The most common symptom is a bulge in the upper thigh or groin. In women, the bulge may form on the outside of the vagina instead. Other symptoms may include:  Mild pain or pressure.  Numbness. Symptoms of a strangulated femoral hernia include:  Sharp or increasing pain.  Nausea and vomiting.  Redness or darkening color of the hernia bulge. How is  this diagnosed? This condition is diagnosed based on:  Your symptoms.  Your medical history.  A physical exam. You may be asked to cough or strain while standing. These actions increase the pressure inside your abdomen and force the hernia through the opening in your muscles. Your health care provider may try to reduce the hernia by pressing on it.  Imaging tests, such as: ? Ultrasound. ? CT scan. How is this treated? Surgery is the only treatment for a femoral hernia. A strangulated hernia requires emergency surgery. Follow these instructions at home: Activity  Return to your normal activities as told by your health care provider. Ask your health care provider what activities are safe for you.  Do not lift anything that is heavier than 10 lb (4.5 kg), or the limit that you are told, until your health care provider says that it is safe. Eating and drinking   Follow instructions from your health care provider about eating or drinking restrictions.  Eat more fiber to prevent constipation. Foods that contain fiber include fruits, vegetables, and whole grains.  Drink enough fluid to keep your urine pale yellow. This also helps to prevent constipation. General instructions  Take over-the-counter and prescription medicines only as told by your health care provider.  If you are overweight, work with your health care provider to safely lose weight.  Do not use any products that contain nicotine or tobacco, such as cigarettes and e-cigarettes. If you need help quitting, ask your  health care provider.  Keep all follow-up visits as told by your health care provider. This is important. Contact a health care provider if:  Your hernia becomes uncomfortable.  Your hernia gets larger and you cannot reduce it.  You are constipated. Signs of constipation include: ? Fewer bowel movements in a week than normal. ? Difficulty having a bowel movement. ? Stools that are dry, hard, or larger  than normal.  You strain to pass urine. Get help right away if:  Your hernia suddenly becomes painful.  You have hernia pain that suddenly gets worse.  You have hernia pain along with any of the following: ? Chills. ? Fever. ? Nausea. ? Vomiting.  Your hernia bulge becomes dark, red, or painful to touch. Summary  Having a femoral hernia means that fat or part of the intestine has pushed through a weak area between muscles into an opening in the lower groin (femoral canal).  The most common sign of a femoral hernia is a bulge in the upper thigh or groin. In women, the bulge may form on the outside of the vagina instead.  Surgery is the only treatment for a femoral hernia. If this type of hernia is not treated, it will not go away. This information is not intended to replace advice given to you by your health care provider. Make sure you discuss any questions you have with your health care provider. Document Released: 01/15/2017 Document Revised: 01/15/2017 Document Reviewed: 01/15/2017 Elsevier Interactive Patient Education  2019 [employer]  DASH Eating Plan DASH stands for "Dietary Approaches to Stop Hypertension." The DASH eating plan is a healthy eating plan that has been shown to reduce high blood pressure (hypertension). It may also reduce your risk for type 2 diabetes, heart disease, and stroke. The DASH eating plan may also help with weight loss. What are tips for following this plan?  General guidelines  Avoid eating more than 2,300 mg (milligrams) of salt (sodium) a day. If you have hypertension, you may need to reduce your sodium intake to 1,500 mg a day.  Limit alcohol intake to no more than 1 drink a day for nonpregnant women and 2 drinks a day for men. One drink equals 12 oz of beer, 5 oz of wine, or 1 oz of hard liquor.  Work with your health care provider to maintain a healthy body weight or to lose weight. Ask what an ideal weight is for you.  Get at least  30 minutes of exercise that causes your heart to beat faster (aerobic exercise) most days of the week. Activities may include walking, swimming, or biking.  Work with your health care provider or diet and nutrition specialist (dietitian) to adjust your eating plan to your individual calorie needs. Reading food labels   Check food labels for the amount of sodium per serving. Choose foods with less than 5 percent of the Daily Value of sodium. Generally, foods with less than 300 mg of sodium per serving fit into this eating plan.  To find whole grains, look for the word "whole" as the first word in the ingredient list. Shopping  Buy products labeled as "low-sodium" or "no salt added."  Buy fresh foods. Avoid canned foods and premade or frozen meals. Cooking  Avoid adding salt when cooking. Use salt-free seasonings or herbs instead of table salt or sea salt. Check with your health care provider or pharmacist before using salt substitutes.  Do not fry foods. Cook foods using healthy methods such as  baking, boiling, grilling, and broiling instead.  Cook with heart-healthy oils, such as olive, canola, soybean, or sunflower oil. Meal planning  Eat a balanced diet that includes: ? 5 or more servings of fruits and vegetables each day. At each meal, try to fill half of your plate with fruits and vegetables. ? Up to 6-8 servings of whole grains each day. ? Less than 6 oz of lean meat, poultry, or fish each day. A 3-oz serving of meat is about the same size as a deck of cards. One egg equals 1 oz. ? 2 servings of low-fat dairy each day. ? A serving of nuts, seeds, or beans 5 times each week. ? Heart-healthy fats. Healthy fats called Omega-3 fatty acids are found in foods such as flaxseeds and coldwater fish, like sardines, salmon, and mackerel.  Limit how much you eat of the following: ? Canned or prepackaged foods. ? Food that is high in trans fat, such as fried foods. ? Food that is high in  saturated fat, such as fatty meat. ? Sweets, desserts, sugary drinks, and other foods with added sugar. ? Full-fat dairy products.  Do not salt foods before eating.  Try to eat at least 2 vegetarian meals each week.  Eat more home-cooked food and less restaurant, buffet, and fast food.  When eating at a restaurant, ask that your food be prepared with less salt or no salt, if possible. What foods are recommended? The items listed may not be a complete list. Talk with your dietitian about what dietary choices are best for you. Grains Whole-grain or whole-wheat bread. Whole-grain or whole-wheat pasta. Brown rice. Orpah Cobb. Bulgur. Whole-grain and low-sodium cereals. Pita bread. Low-fat, low-sodium crackers. Whole-wheat flour tortillas. Vegetables Fresh or frozen vegetables (raw, steamed, roasted, or grilled). Low-sodium or reduced-sodium tomato and vegetable juice. Low-sodium or reduced-sodium tomato sauce and tomato paste. Low-sodium or reduced-sodium canned vegetables. Fruits All fresh, dried, or frozen fruit. Canned fruit in natural juice (without added sugar). Meat and other protein foods Skinless chicken or [place]. Ground chicken or [place]. Pork with fat trimmed off. Fish and seafood. Egg whites. Dried beans, peas, or lentils. Unsalted nuts, nut butters, and seeds. Unsalted canned beans. Lean cuts of beef with fat trimmed off. Low-sodium, lean deli meat. Dairy Low-fat (1%) or fat-free (skim) milk. Fat-free, low-fat, or reduced-fat cheeses. Nonfat, low-sodium ricotta or cottage cheese. Low-fat or nonfat yogurt. Low-fat, low-sodium cheese. Fats and oils Soft margarine without trans fats. Vegetable oil. Low-fat, reduced-fat, or light mayonnaise and salad dressings (reduced-sodium). Canola, safflower, olive, soybean, and sunflower oils. Avocado. Seasoning and other foods Herbs. Spices. Seasoning mixes without salt. Unsalted popcorn and pretzels. Fat-free sweets. What foods are not  recommended? The items listed may not be a complete list. Talk with your dietitian about what dietary choices are best for you. Grains Baked goods made with fat, such as croissants, muffins, or some breads. Dry pasta or rice meal packs. Vegetables Creamed or fried vegetables. Vegetables in a cheese sauce. Regular canned vegetables (not low-sodium or reduced-sodium). Regular canned tomato sauce and paste (not low-sodium or reduced-sodium). Regular tomato and vegetable juice (not low-sodium or reduced-sodium). Rosita Fire. Olives. Fruits Canned fruit in a light or heavy syrup. Fried fruit. Fruit in cream or butter sauce. Meat and other protein foods Fatty cuts of meat. Ribs. Fried meat. Tomasa Blase. Sausage. Bologna and other processed lunch meats. Salami. Fatback. Hotdogs. Bratwurst. Salted nuts and seeds. Canned beans with added salt. Canned or smoked fish. Whole eggs or egg yolks.  Chicken or [place] with skin. Dairy Whole or 2% milk, cream, and half-and-half. Whole or full-fat cream cheese. Whole-fat or sweetened yogurt. Full-fat cheese. Nondairy creamers. Whipped toppings. Processed cheese and cheese spreads. Fats and oils Butter. Stick margarine. Lard. Shortening. Ghee. Bacon fat. Tropical oils, such as coconut, palm kernel, or palm oil. Seasoning and other foods Salted popcorn and pretzels. Onion salt, garlic salt, seasoned salt, table salt, and sea salt. Worcestershire sauce. Tartar sauce. Barbecue sauce. Teriyaki sauce. Soy sauce, including reduced-sodium. Steak sauce. Canned and packaged gravies. Fish sauce. Oyster sauce. Cocktail sauce. Horseradish that you find on the shelf. Ketchup. Mustard. Meat flavorings and tenderizers. Bouillon cubes. Hot sauce and Tabasco sauce. Premade or packaged marinades. Premade or packaged taco seasonings. Relishes. Regular salad dressings. Where to find more information:  National Heart, Lung, and Blood Institute: PopSteam.is  American Heart Association:  www.heart.org Summary  The DASH eating plan is a healthy eating plan that has been shown to reduce high blood pressure (hypertension). It may also reduce your risk for type 2 diabetes, heart disease, and stroke.  With the DASH eating plan, you should limit salt (sodium) intake to 2,300 mg a day. If you have hypertension, you may need to reduce your sodium intake to 1,500 mg a day.  When on the DASH eating plan, aim to eat more fresh fruits and vegetables, whole grains, lean proteins, low-fat dairy, and heart-healthy fats.  Work with your health care provider or diet and nutrition specialist (dietitian) to adjust your eating plan to your individual calorie needs. This information is not intended to replace advice given to you by your health care provider. Make sure you discuss any questions you have with your health care provider. Document Released: 07/31/2011 Document Revised: 08/04/2016 Document Reviewed: 08/04/2016 Elsevier Interactive Patient Education  2019 [employer].

## 2019-01-25 NOTE — [place] Notes (Signed)
Patient Donald Landry  New Patient--Establish Landry  Subjective:  Patient ID: Donald Landry, male    DOB: [birth year]  Age: 48 y.o. MRN: [id number]  CC:  Chief Complaint  Patient presents with  . Establish Landry    HPI Donald Landry is a 48 year old male who presents to Establish Landry today. He has c/o increasing right lower abdominal/groin area X 2 months. He states that it swells, and tenderness when he works hard, then the swelling decreases after rest. He continues to work for himself, but has recently realized that he needs an Environmental consultant. He has occasional back pain, which he takes OTC pain medications for relief.  He denies fevers, chills, fatigue, recent infections, weight loss, and night sweats. He has not had any headaches, visual changes, dizziness, and falls. No chest pain, heart palpitations, cough and shortness of breath reported. No reports of GI problems such as nausea, vomiting, diarrhea, and constipation. He has no reports of blood in stools, dysuria and hematuria. No depression or anxiety reported.   No past surgical history on file.  No family history on file.  Social History   Socioeconomic History  . Marital status: Married    Spouse name: Not on file  . Number of children: Not on file  . Years of education: Not on file  . Highest education level: Not on file  Occupational History  . Not on file  Social Needs  . Financial resource strain: Not on file  . Food insecurity:    Worry: Not on file    Inability: Not on file  . Transportation needs:    Medical: Not on file    Non-medical: Not on file  Tobacco Use  . Smoking status: Never Smoker  . Smokeless tobacco: Never Used  Substance and Sexual Activity  . Alcohol use: Yes  . Drug use: Never  . Sexual activity: Not on file  Lifestyle  . Physical activity:    Days per week: Not on file    Minutes per session: Not on file  . Stress: Not on file   Relationships  . Social connections:    Talks on phone: Not on file    Gets together: Not on file    Attends religious service: Not on file    Active member of club or organization: Not on file    Attends meetings of clubs or organizations: Not on file    Relationship status: Not on file  . Intimate partner violence:    Fear of current or ex partner: Not on file    Emotionally abused: Not on file    Physically abused: Not on file    Forced sexual activity: Not on file  Other Topics Concern  . Not on file  Social History Narrative  . Not on file    Outpatient Medications Prior to Visit  Medication Sig Dispense Refill  . ibuprofen (ADVIL,MOTRIN) 200 MG tablet Take 400 mg by mouth every 6 (six) hours as needed.    Marland Kitchen amoxicillin-clavulanate (AUGMENTIN) 875-125 MG tablet Take 1 tablet by mouth 2 (two) times daily. 20 tablet 0  . doxycycline (VIBRAMYCIN) 100 MG capsule Take 1 capsule (100 mg total) by mouth 2 (two) times daily. 20 capsule 0   No facility-administered medications prior to visit.     No Known Allergies  ROS Review of Systems  Constitutional: Negative.   HENT: Negative.   Eyes: Negative.   Respiratory: Negative.  Cardiovascular: Negative.   Gastrointestinal: Negative.        Right lower abdomen/groin pain  Endocrine: Negative.   Genitourinary: Negative.   Musculoskeletal: Negative.   Skin: Negative.   Allergic/Immunologic: Negative.   Neurological: Negative.   Hematological: Negative.   Psychiatric/Behavioral: Negative.    Objective:    Physical Exam  Constitutional: He is oriented to person, place, and time. He appears well-developed and well-nourished.  HENT:  Head: Normocephalic and atraumatic.  Eyes: Conjunctivae are normal.  Neck: Normal range of motion. Neck supple.  Cardiovascular: Normal rate, regular rhythm, normal heart sounds and intact distal pulses.  Pulmonary/Chest: Effort normal and breath sounds normal.  Abdominal: Soft. Bowel sounds  are normal.  Musculoskeletal: Normal range of motion.  Neurological: He is alert and oriented to person, place, and time. He has normal reflexes.  Skin: Skin is warm and dry.  Psychiatric: He has a normal mood and affect. His behavior is normal. Judgment and thought content normal.  Nursing note and vitals reviewed.   BP 130/80 (BP Location: Left Arm, Patient Position: Sitting, Cuff Size: Large)   Pulse 78   Temp 97.6 F (36.4 C) (Oral)   Ht 5' 6"  (1.676 m)   Wt 192 lb (87.1 kg)   SpO2 100%   BMI 30.99 kg/m  Wt Readings from Last 3 Encounters:  01/25/19 192 lb (87.1 kg)  03/24/18 219 lb (99.3 kg)  05/23/15 218 lb (98.9 kg)     Health Maintenance Due  Topic Date Due  . HIV Screening  06/22/1985    There are no preventive Landry reminders to display for this patient.  No results found for: TSH No results found for: WBC, HGB, HCT, MCV, PLT No results found for: NA, K, CHLORIDE, CO2, GLUCOSE, BUN, CREATININE, BILITOT, ALKPHOS, AST, ALT, PROT, ALBUMIN, CALCIUM, ANIONGAP, EGFR, GFR No results found for: CHOL No results found for: HDL No results found for: LDLCALC No results found for: TRIG No results found for: [facility] Lab Results  Component Value Date   HGBA1C 5.1 01/25/2019    Assessment & Plan:   1. Establish Landry  2. Right groin pain We will order Ultrasound today.   3. Femoral hernia of right side - US Pelvis Limited  4. Screening for diabetes mellitus Hgb A1c within normal range. He will continue to decrease foods/beverages high in sugars and carbs and follow Heart Healthy or DASH diet. Increase physical activity to at least 30 minutes cardio exercise daily.  - POCT glycosylated hemoglobin (Hb A1C) - POCT urinalysis dipstick  5. Abnormal urinalysis Results are pending. - Urine Culture  6. Healthcare maintenance - CBC with Differential - Comprehensive metabolic panel - Lipid Panel - TSH - Vitamin D, 25-hydroxy - Vitamin B12 - HepB+HepC+HIV Panel   7. Follow up He will follow up in 3 months.   No orders of the defined types were placed in this encounter.   Orders Placed This Encounter  Procedures  . Urine Culture  . US Pelvis Limited  . CBC with Differential  . Comprehensive metabolic panel  . Lipid Panel  . TSH  . Vitamin D, 25-hydroxy  . Vitamin B12  . HepB+HepC+HIV Panel  . POCT glycosylated hemoglobin (Hb A1C)  . POCT urinalysis dipstick    Referral Orders  No referral(s) requested today    Kathe Becton,  MSN, FNP-BC Patient [place], [place] [phone]   Problem List Items Addressed This Visit  None    Visit Diagnoses    Right groin pain    -  Primary   Femoral hernia of right side       Relevant Orders   US Pelvis Limited   Screening for diabetes mellitus       Relevant Orders   POCT glycosylated hemoglobin (Hb A1C) (Completed)   POCT urinalysis dipstick (Completed)   Abnormal urinalysis       Relevant Orders   Urine Culture   Healthcare maintenance       Relevant Orders   CBC with Differential   Comprehensive metabolic panel   Lipid Panel   TSH   Vitamin D, 25-hydroxy   Vitamin B12   HepB+HepC+HIV Panel   Follow up          No orders of the defined types were placed in this encounter.   Follow-up: Return in about 3 months (around 04/27/2019).    Azzie Glatter, FNP

## 2019-01-26 ENCOUNTER — Ambulatory Visit (HOSPITAL_COMMUNITY)
Admission: RE | Admit: 2019-01-26 | Discharge: 2019-01-26 | Disposition: A | Discharge status: Home / Self Care | Payer: Self-pay | Source: Ambulatory Visit | Attending: Family Medicine | Admitting: Family Medicine

## 2019-01-26 ENCOUNTER — Other Ambulatory Visit: Payer: Self-pay

## 2019-01-26 DIAGNOSIS — K4190 Unilateral femoral hernia, without obstruction or gangrene, not specified as recurrent: Secondary | ICD-10-CM | POA: Insufficient documentation

## 2019-01-26 DIAGNOSIS — R1031 Right lower quadrant pain: Secondary | ICD-10-CM | POA: Insufficient documentation

## 2019-01-26 LAB — CBC WITH DIFFERENTIAL/PLATELET
Basophils Absolute: 0 10*3/uL (ref 0.0–0.2)
Basos: 0 %
EOS (ABSOLUTE): 0.1 10*3/uL (ref 0.0–0.4)
Eos: 2 %
Hematocrit: 46.8 % (ref 37.5–51.0)
Hemoglobin: 16.4 g/dL (ref 13.0–17.7)
Immature Grans (Abs): 0 10*3/uL (ref 0.0–0.1)
Immature Granulocytes: 0 %
Lymphocytes Absolute: 1.4 10*3/uL (ref 0.7–3.1)
Lymphs: 23 %
MCH: 33.3 pg — ABNORMAL HIGH (ref 26.6–33.0)
MCHC: 35 g/dL (ref 31.5–35.7)
MCV: 95 fL (ref 79–97)
Monocytes Absolute: 0.6 10*3/uL (ref 0.1–0.9)
Monocytes: 9 %
Neutrophils Absolute: 3.9 10*3/uL (ref 1.4–7.0)
Neutrophils: 66 %
Platelets: 250 10*3/uL (ref 150–450)
RBC: 4.92 x10E6/uL (ref 4.14–5.80)
RDW: 12.3 % (ref 11.6–15.4)
WBC: 6.1 10*3/uL (ref 3.4–10.8)

## 2019-01-26 LAB — TSH: TSH: 2.11 u[IU]/mL (ref 0.450–4.500)

## 2019-01-26 LAB — LIPID PANEL
Chol/HDL Ratio: 2.8 ratio (ref 0.0–5.0)
Cholesterol, Total: 145 mg/dL (ref 100–199)
HDL: 52 mg/dL (ref >39)
LDL Calculated: 83 mg/dL (ref 0–99)
Triglycerides: 52 mg/dL (ref 0–149)
VLDL Cholesterol Cal: 10 mg/dL (ref 5–40)

## 2019-01-26 LAB — VITAMIN B12: Vitamin B-12: 551 pg/mL (ref 232–1245)

## 2019-01-26 LAB — COMPREHENSIVE METABOLIC PANEL
ALT: 16 IU/L (ref 0–44)
AST: 20 IU/L (ref 0–40)
Albumin/Globulin Ratio: 2 (ref 1.2–2.2)
Albumin: 4.7 g/dL (ref 4.0–5.0)
Alkaline Phosphatase: 66 IU/L (ref 39–117)
BUN/Creatinine Ratio: 8 — ABNORMAL LOW (ref 9–20)
BUN: 8 mg/dL (ref 6–24)
Bilirubin Total: 0.5 mg/dL (ref 0.0–1.2)
CO2: 25 mmol/L (ref 20–29)
Calcium: 9.1 mg/dL (ref 8.7–10.2)
Chloride: 105 mmol/L (ref 96–106)
Creatinine, Ser: 0.98 mg/dL (ref 0.76–1.27)
GFR calc Af Amer: 105 mL/min/{1.73_m2} (ref >59)
GFR calc non Af Amer: 91 mL/min/{1.73_m2} (ref >59)
Globulin, Total: 2.4 g/dL (ref 1.5–4.5)
Glucose: 97 mg/dL (ref 65–99)
Potassium: 4.3 mmol/L (ref 3.5–5.2)
Sodium: 143 mmol/L (ref 134–144)
Total Protein: 7.1 g/dL (ref 6.0–8.5)

## 2019-01-26 LAB — HEPB+HEPC+HIV PANEL
HIV Screen 4th Generation wRfx: NONREACTIVE
Hep B C IgM: NEGATIVE
Hep B Core Total Ab: NEGATIVE
Hep B E Ab: NEGATIVE
Hep B E Ag: NEGATIVE
Hep B Surface Ab, Qual: NONREACTIVE
Hep C Virus Ab: <0.1 s/co ratio (ref 0.0–0.9)
Hepatitis B Surface Ag: NEGATIVE

## 2019-01-26 LAB — VITAMIN D 25 HYDROXY (VIT D DEFICIENCY, FRACTURES): Vit D, 25-Hydroxy: 22.3 ng/mL — ABNORMAL LOW (ref 30.0–100.0)

## 2019-01-27 LAB — URINE CULTURE

## 2019-01-31 ENCOUNTER — Telehealth: Payer: Self-pay

## 2019-01-31 NOTE — Telephone Encounter (Signed)
Ultrasound is negative for hernia. He is to report to office if symptoms worsen. Written material on 'groin strain' mailed to patient. Please inform patient.

## 2019-01-31 NOTE — Telephone Encounter (Signed)
-----   Message from Azzie Glatter, [place] sent at 01/31/2019 10:44 AM EDT ----- Vitamin D level is mildly decreased. Patient to take daily Vitamin D supplement as directed. All other labs are stable. Please inform patient.

## 2019-01-31 NOTE — Telephone Encounter (Signed)
Called, no answer. Left a message for patient to call back. Thanks!  

## 2019-01-31 NOTE — Telephone Encounter (Signed)
Mailed letter °

## 2019-02-01 ENCOUNTER — Telehealth: Payer: Self-pay

## 2019-02-01 NOTE — Telephone Encounter (Signed)
Patient has been notified of lab results and imaging results

## 2019-03-16 ENCOUNTER — Ambulatory Visit: Payer: Self-pay | Admitting: Family Medicine

## 2019-03-26 DIAGNOSIS — A048 Other specified bacterial intestinal infections: Secondary | ICD-10-CM

## 2019-03-26 HISTORY — DX: Other specified bacterial intestinal infections: A04.8

## 2019-03-28 ENCOUNTER — Encounter: Payer: Self-pay | Admitting: Family Medicine

## 2019-03-28 ENCOUNTER — Ambulatory Visit (INDEPENDENT_AMBULATORY_CARE_PROVIDER_SITE_OTHER): Payer: Self-pay | Admitting: Family Medicine

## 2019-03-28 ENCOUNTER — Other Ambulatory Visit: Payer: Self-pay

## 2019-03-28 VITALS — BP 100/68 | HR 64 | Temp 98.8°F | Ht 66.0 in | Wt 201.8 lb

## 2019-03-28 DIAGNOSIS — R8290 Unspecified abnormal findings in urine: Secondary | ICD-10-CM

## 2019-03-28 DIAGNOSIS — K4190 Unilateral femoral hernia, without obstruction or gangrene, not specified as recurrent: Secondary | ICD-10-CM

## 2019-03-28 DIAGNOSIS — Z09 Encounter for follow-up examination after completed treatment for conditions other than malignant neoplasm: Secondary | ICD-10-CM

## 2019-03-28 DIAGNOSIS — K219 Gastro-esophageal reflux disease without esophagitis: Secondary | ICD-10-CM

## 2019-03-28 DIAGNOSIS — R1031 Right lower quadrant pain: Secondary | ICD-10-CM

## 2019-03-28 LAB — POCT URINALYSIS DIP (MANUAL ENTRY)
Glucose, UA: NEGATIVE mg/dL
Ketones, POC UA: NEGATIVE mg/dL
Leukocytes, UA: NEGATIVE
Nitrite, UA: NEGATIVE
Protein Ur, POC: NEGATIVE mg/dL
Spec Grav, UA: >=1.03 — AB (ref 1.010–1.025)
Urobilinogen, UA: 1 E.U./dL
pH, UA: 6 (ref 5.0–8.0)

## 2019-03-28 MED ORDER — FAMOTIDINE 20 MG PO TABS
20.0000 mg | ORAL_TABLET | Freq: Two times a day (BID) | ORAL | 1 refills | Status: DC
Start: 2019-03-28 — End: 2019-04-04

## 2019-03-28 MED ORDER — IBUPROFEN 800 MG PO TABS: 800.0000 mg | ORAL_TABLET | Freq: Three times a day (TID) | ORAL | 3 refills | Status: DC | PRN

## 2019-03-28 NOTE — [place] Notes (Signed)
Patient [facility]   Sick Visit  Subjective:  Patient ID: Donald Landry, male    DOB: 1971-05-13  Age: 48 y.o. MRN: [id number]  CC:  Chief Complaint  Patient presents with  . Abdominal Pain    possible ulcers    HPI Donald Landry is a 48 year old male who presents for follow up today.   Past Medical History:  Diagnosis Date  . Back pain    Current Status: Since his last office visit, he has c/o continuous right groin pain and swelling. He states that his symptoms are the same since his last office visit. Latest Ultrasound of right pelvis area on 01/26/2019, noted mildly enlarge right inguinal lymph node, with no definite hernia noted. He states that right groin area continues to swell and distract him from ADLs and work duties. He does not take any medications, but does have periods of rest until swelling subsides. He continues to have moderate back pain.   He denies fevers, chills, fatigue, recent infections, weight loss, and night sweats. He has not had any headaches, visual changes, dizziness, and falls. No chest pain, heart palpitations, cough and shortness of breath reported. No reports of GI problems such as nausea, vomiting, diarrhea, and constipation. He has no reports of blood in stools, dysuria and hematuria. No depression or anxiety reported.   No past surgical history on file.  Family History  Problem Relation Age of Onset  . Diabetes Sister   . Diabetes Sister     Social History   Socioeconomic History  . Marital status: Married    Spouse name: Not on file  . Number of children: Not on file  . Years of education: Not on file  . Highest education level: Not on file  Occupational History  . Not on file  Social Needs  . Financial resource strain: Not on file  . Food insecurity    Worry: Not on file    Inability: Not on file  . Transportation needs    Medical: Not on file    Non-medical: Not on file  Tobacco  Use  . Smoking status: Never Smoker  . Smokeless tobacco: Never Used  Substance and Sexual Activity  . Alcohol use: Yes  . Drug use: Never  . Sexual activity: Not on file  Lifestyle  . Physical activity    Days per week: Not on file    Minutes per session: Not on file  . Stress: Not on file  Relationships  . Social Herbalist on phone: Not on file    Gets together: Not on file    Attends religious service: Not on file    Active member of club or organization: Not on file    Attends meetings of clubs or organizations: Not on file    Relationship status: Not on file  . Intimate partner violence    Fear of current or ex partner: Not on file    Emotionally abused: Not on file    Physically abused: Not on file    Forced sexual activity: Not on file  Other Topics Concern  . Not on file  Social History Narrative  . Not on file    Outpatient Medications Prior to Visit  Medication Sig Dispense Refill  . ibuprofen (ADVIL,MOTRIN) 200 MG tablet Take 400 mg by mouth every 6 (six) hours as needed.     No facility-administered medications prior to visit.  No Known Allergies  ROS Review of Systems  Constitutional: Negative.   HENT: Negative.   Eyes: Negative.   Respiratory: Negative.   Cardiovascular: Negative.   Gastrointestinal: Negative.   Endocrine: Negative.   Genitourinary: Negative.        Right groin pain and swelling.  Musculoskeletal: Positive for back pain (chronic).  Skin: Negative.   Allergic/Immunologic: Negative.   Neurological: Negative.   Hematological: Negative.   Psychiatric/Behavioral: Negative.       Objective:    Physical Exam  Constitutional: He is oriented to person, place, and time. He appears well-developed and well-nourished.  HENT:  Head: Normocephalic and atraumatic.  Eyes: Conjunctivae are normal.  Neck: Normal range of motion. Neck supple.  Cardiovascular: Normal rate, regular rhythm, normal heart sounds and intact distal  pulses.  Pulmonary/Chest: Effort normal and breath sounds normal.  Abdominal: Soft. Bowel sounds are normal.  Genitourinary:     Musculoskeletal: Normal range of motion.        General: Tenderness present.  Neurological: He is alert and oriented to person, place, and time. He has normal reflexes.  Skin: Skin is warm and dry.  Psychiatric: He has a normal mood and affect. His behavior is normal. Thought content normal.  Nursing note and vitals reviewed.   BP 100/68 (BP Location: Left Arm, Patient Position: Sitting, Cuff Size: Large)   Pulse 64   Temp 98.8 F (37.1 C) (Oral)   Ht 5\' 6"  (1.676 m)   Wt 201 lb 12.8 oz (91.5 kg)   SpO2 100%   BMI 32.57 kg/m  Wt Readings from Last 3 Encounters:  03/28/19 201 lb 12.8 oz (91.5 kg)  01/25/19 192 lb (87.1 kg)  03/24/18 219 lb (99.3 kg)     Health Maintenance Due  Topic Date Due  . INFLUENZA VACCINE  03/26/2019    There are no preventive care reminders to display for this patient.  Lab Results  Component Value Date   TSH 2.110 01/25/2019   Lab Results  Component Value Date   WBC 6.1 01/25/2019   HGB 16.4 01/25/2019   HCT 46.8 01/25/2019   MCV 95 01/25/2019   PLT 250 01/25/2019   Lab Results  Component Value Date   NA 143 01/25/2019   K 4.3 01/25/2019   CO2 25 01/25/2019   GLUCOSE 97 01/25/2019   BUN 8 01/25/2019   CREATININE 0.98 01/25/2019   BILITOT 0.5 01/25/2019   ALKPHOS 66 01/25/2019   AST 20 01/25/2019   ALT 16 01/25/2019   PROT 7.1 01/25/2019   ALBUMIN 4.7 01/25/2019   CALCIUM 9.1 01/25/2019   Lab Results  Component Value Date   CHOL 145 01/25/2019   Lab Results  Component Value Date   HDL 52 01/25/2019   Lab Results  Component Value Date   LDLCALC 83 01/25/2019   Lab Results  Component Value Date   TRIG 52 01/25/2019   Lab Results  Component Value Date   CHOLHDL 2.8 01/25/2019   Lab Results  Component Value Date   HGBA1C 5.1 01/25/2019      Assessment & Plan:   1. Right groin  pain We will initiate Motrin today.  - ibuprofen (ADVIL) 800 MG tablet; Take 1 tablet (800 mg total) by mouth every 8 (eight) hours as needed.  Dispense: 30 tablet; Refill: 3  2. Femoral hernia of right side Moderate discomfort today. Possible CT Scan in the future. We will continue to monitor.   3. Gastroesophageal reflux disease without esophagitis  We will test him for H. Pylori today.  - H. pylori breath test - famotidine (PEPCID) 20 MG tablet; Take 1 tablet (20 mg total) by mouth 2 (two) times daily.  Dispense: 60 tablet; Refill: 1  4. Abnormal urinalysis Results are pending.  - Urine Culture  5. Follow up He will follow up in 2 months.  - POCT urinalysis dipstick  Meds ordered this encounter  Medications  . famotidine (PEPCID) 20 MG tablet    Sig: Take 1 tablet (20 mg total) by mouth 2 (two) times daily.    Dispense:  60 tablet    Refill:  1  . ibuprofen (ADVIL) 800 MG tablet    Sig: Take 1 tablet (800 mg total) by mouth every 8 (eight) hours as needed.    Dispense:  30 tablet    Refill:  3    Orders Placed This Encounter  Procedures  . Urine Culture  . H. pylori breath test  . POCT urinalysis dipstick    Referral Orders  No referral(s) requested today   Raliegh IpNatalie Kelsie Zaborowski,  MSN, FNP-BC [facility] [address]  [place], [place] [id number] [phone]    Problem List Items Addressed This Visit      Other   Femoral hernia of right side   Right groin pain - Primary   Relevant Medications   ibuprofen (ADVIL) 800 MG tablet    Other Visit Diagnoses    Gastroesophageal reflux disease without esophagitis       Relevant Medications   famotidine (PEPCID) 20 MG tablet   Other Relevant Orders   H. pylori breath test   Abnormal urinalysis       Relevant Orders   Urine Culture   Follow up       Relevant Orders   POCT urinalysis dipstick (Completed)      Meds ordered  this encounter  Medications  . famotidine (PEPCID) 20 MG tablet    Sig: Take 1 tablet (20 mg total) by mouth 2 (two) times daily.    Dispense:  60 tablet    Refill:  1  . ibuprofen (ADVIL) 800 MG tablet    Sig: Take 1 tablet (800 mg total) by mouth every 8 (eight) hours as needed.    Dispense:  30 tablet    Refill:  3    Follow-up: Return in about 2 months (around 05/28/2019).    Kallie LocksNatalie M Sherise Geerdes, FNP

## 2019-03-28 NOTE — Patient Instructions (Addendum)
Ibuprofen tablets and capsules What is this medicine? IBUPROFEN (eye BYOO proe fen) is a non-steroidal anti-inflammatory drug (NSAID). It is used for dental pain, fever, headaches or migraines, osteoarthritis, rheumatoid arthritis, or painful monthly periods. It can also relieve minor aches and pains caused by a cold, flu, or sore throat. This medicine may be used for other purposes; ask your health care provider or pharmacist if you have questions. COMMON BRAND NAME(S): Advil, Advil Junior Strength, Advil Migraine, Genpril, Ibren, IBU, Midol, Midol Cramps and Body Aches, Motrin, Motrin IB, Motrin Junior Strength, Motrin Migraine Pain, Samson-8, Toxicology Saliva Collection What should I tell my health care provider before I take this medicine? They need to know if you have any of these conditions:  cigarette smoker  coronary artery bypass graft (CABG) surgery within the past 2 weeks  drink more than 3 alcohol-containing drinks a day  heart disease  high blood pressure  history of stomach bleeding  kidney disease  liver disease  lung or breathing disease, like asthma  an unusual or allergic reaction to ibuprofen, aspirin, other NSAIDs, other medicines, foods, dyes, or preservatives  pregnant or trying to get pregnant  breast-feeding How should I use this medicine? Take this medicine by mouth with a glass of water. Follow the directions on the prescription label. Take this medicine with food if your stomach gets upset. Try to not lie down for at least 10 minutes after you take the medicine. Take your medicine at regular intervals. Do not take your medicine more often than directed. A special MedGuide will be given to you by the pharmacist with each prescription and refill. Be sure to read this information carefully each time. Talk to your pediatrician regarding the use of this medicine in children. Special care may be needed. Overdosage: If you think you have taken too much of this  medicine contact a poison control center or emergency room at once. NOTE: This medicine is only for you. Do not share this medicine with others. What if I miss a dose? If you miss a dose, take it as soon as you can. If it is almost time for your next dose, take only that dose. Do not take double or extra doses. What may interact with this medicine? Do not take this medicine with any of the following medications:  cidofovir  ketorolac  methotrexate  pemetrexed This medicine may also interact with the following medications:  alcohol  aspirin  diuretics  lithium  other drugs for inflammation like prednisone  warfarin This list may not describe all possible interactions. Give your health care provider a list of all the medicines, herbs, non-prescription drugs, or dietary supplements you use. Also tell them if you smoke, drink alcohol, or use illegal drugs. Some items may interact with your medicine. What should I watch for while using this medicine? Tell your doctor or healthcare provider if your symptoms do not start to get better or if they get worse. This medicine may cause serious skin reactions. They can happen weeks to months after starting the medicine. Contact your healthcare provider right away if you notice fevers or flu-like symptoms with a rash. The rash may be red or purple and then turn into blisters or peeling of the skin. Or, you might notice a red rash with swelling of the face, lips or lymph nodes in your neck or under your arms. This medicine does not prevent heart attack or stroke. In fact, this medicine may increase the chance of a heart  attack or stroke. The chance may increase with longer use of this medicine and in people who have heart disease. If you take aspirin to prevent heart attack or stroke, talk with your doctor or healthcare provider. Do not take other medicines that contain aspirin, ibuprofen, or naproxen with this medicine. Side effects such as stomach  upset, nausea, or ulcers may be more likely to occur. Many medicines available without a prescription should not be taken with this medicine. This medicine can cause ulcers and bleeding in the stomach and intestines at any time during treatment. Ulcers and bleeding can happen without warning symptoms and can cause death. To reduce your risk, do not smoke cigarettes or drink alcohol while you are taking this medicine. You may get drowsy or dizzy. Do not drive, use machinery, or do anything that needs mental alertness until you know how this medicine affects you. Do not stand or sit up quickly, especially if you are an older patient. This reduces the risk of dizzy or fainting spells. This medicine can cause you to bleed more easily. Try to avoid damage to your teeth and gums when you brush or floss your teeth. This medicine may be used to treat migraines. If you take migraine medicines for 10 or more days a month, your migraines may get worse. Keep a diary of headache days and medicine use. Contact your healthcare provider if your migraine attacks occur more frequently. What side effects may I notice from receiving this medicine? Side effects that you should report to your doctor or health care professional as soon as possible:  allergic reactions like skin rash, itching or hives, swelling of the face, lips, or tongue  redness, blistering, peeling or loosening of the skin, including inside the mouth  severe stomach pain  signs and symptoms of bleeding such as bloody or black, tarry stools; red or dark-brown urine; spitting up blood or brown material that looks like coffee grounds; red spots on the skin; unusual bruising or bleeding from the eye, gums, or nose  signs and symptoms of a blood clot such as changes in vision; chest pain; severe, sudden headache; trouble speaking; sudden numbness or weakness of the face, arm, or leg  unexplained weight gain or swelling  unusually weak or  tired  yellowing of eyes or skin Side effects that usually do not require medical attention (report to your doctor or health care professional if they continue or are bothersome):  bruising  diarrhea  dizziness, drowsiness  headache  nausea, vomiting This list may not describe all possible side effects. Call your doctor for medical advice about side effects. You may report side effects to FDA at 1-800-FDA-1088. Where should I keep my medicine? Keep out of the reach of children. Store at room temperature between 15 and 30 degrees C (59 and 86 degrees F). Keep container tightly closed. Throw away any unused medicine after the expiration date. NOTE: This sheet is a summary. It may not cover all possible information. If you have questions about this medicine, talk to your doctor, pharmacist, or health care provider.  2020 Elsevier/Gold Standard (2018-10-27 14:11:00) Famotidine tablets or gelcaps What is this medicine? FAMOTIDINE (fa MOE ti deen) is a type of antihistamine that blocks the release of stomach acid. It is used to treat stomach or intestinal ulcers. It can also relieve heartburn from acid reflux. This medicine may be used for other purposes; ask your health care provider or pharmacist if you have questions. COMMON BRAND NAME(S): Heartburn Relief,  Pepcid, Pepcid AC, Pepcid AC Maximum Strength What should I tell my health care provider before I take this medicine? They need to know if you have any of these conditions:  kidney or liver disease  trouble swallowing  an unusual or allergic reaction to famotidine, other medicines, foods, dyes, or preservatives  pregnant or trying to get pregnant  breast-feeding How should I use this medicine? Take this medicine by mouth with a glass of water. Follow the directions on the prescription label. If you only take this medicine once a day, take it at bedtime. Take your doses at regular intervals. Do not take your medicine more often  than directed. Talk to your pediatrician regarding the use of this medicine in children. Special care may be needed. Overdosage: If you think you have taken too much of this medicine contact a poison control center or emergency room at once. NOTE: This medicine is only for you. Do not share this medicine with others. What if I miss a dose? If you miss a dose, take it as soon as you can. If it is almost time for your next dose, take only that dose. Do not take double or extra doses. What may interact with this medicine?  delavirdine  itraconazole  ketoconazole This list may not describe all possible interactions. Give your health care provider a list of all the medicines, herbs, non-prescription drugs, or dietary supplements you use. Also tell them if you smoke, drink alcohol, or use illegal drugs. Some items may interact with your medicine. What should I watch for while using this medicine? Tell your doctor or health care professional if your condition does not start to get better or if it gets worse. Finish the full course of tablets prescribed, even if you feel better. Do not take with aspirin, ibuprofen or other antiinflammatory medicines. These can make your condition worse. Do not smoke cigarettes or drink alcohol. These cause irritation in your stomach and can increase the time it will take for ulcers to heal. If you get black, tarry stools or vomit up what looks like coffee grounds, call your doctor or health care professional at once. You may have a bleeding ulcer. This medicine may cause a decrease in vitamin B12. You should make sure that you get enough vitamin B12 while you are taking this medicine. Discuss the foods you eat and the vitamins you take with your health care professional. What side effects may I notice from receiving this medicine? Side effects that you should report to your doctor or health care professional as soon as possible:  agitation,  nervousness  confusion  hallucinations  skin rash, itching Side effects that usually do not require medical attention (report to your doctor or health care professional if they continue or are bothersome):  constipation  diarrhea  dizziness  headache This list may not describe all possible side effects. Call your doctor for medical advice about side effects. You may report side effects to FDA at 1-800-FDA-1088. Where should I keep my medicine? Keep out of the reach of children. Store at room temperature between 15 and 30 degrees C (59 and 86 degrees F). Do not freeze. Throw away any unused medicine after the expiration date. NOTE: This sheet is a summary. It may not cover all possible information. If you have questions about this medicine, talk to your doctor, pharmacist, or health care provider.  2020 Elsevier/Gold Standard (2017-03-27 13:17:50)

## 2019-03-29 ENCOUNTER — Other Ambulatory Visit: Payer: Self-pay

## 2019-03-29 DIAGNOSIS — K219 Gastro-esophageal reflux disease without esophagitis: Secondary | ICD-10-CM | POA: Insufficient documentation

## 2019-03-30 LAB — URINE CULTURE: Organism ID, Bacteria: NO GROWTH

## 2019-03-31 LAB — H. PYLORI BREATH TEST: H pylori Breath Test: POSITIVE — AB

## 2019-04-04 ENCOUNTER — Encounter: Payer: Self-pay | Admitting: Family Medicine

## 2019-04-04 ENCOUNTER — Other Ambulatory Visit: Payer: Self-pay | Admitting: Family Medicine

## 2019-04-04 ENCOUNTER — Telehealth: Payer: Self-pay

## 2019-04-04 DIAGNOSIS — A048 Other specified bacterial intestinal infections: Secondary | ICD-10-CM

## 2019-04-04 MED ORDER — OMEPRAZOLE 20 MG PO CPDR: 20.0000 mg | DELAYED_RELEASE_CAPSULE | Freq: Every day | ORAL | 0 refills | Status: DC

## 2019-04-04 MED ORDER — AMOXICILLIN 500 MG PO TABS: ORAL_TABLET | ORAL | 0 refills | Status: DC

## 2019-04-04 MED ORDER — METRONIDAZOLE 500 MG PO TABS: 500.0000 mg | ORAL_TABLET | Freq: Two times a day (BID) | ORAL | 0 refills | Status: AC

## 2019-04-04 NOTE — Telephone Encounter (Signed)
-----   Message from Azzie Glatter, [place] sent at 04/04/2019  8:30 AM EDT ----- Regarding: "Labs" Attempted to contact patient today. Voice message left.   Please contact patient to inform him that he is positive for H. Pylori Infection. Rxs (3) sent to pharmacy today. He is to take as prescribed until complete. He should avoid alcohol while taking these medications.   Bilirubin is elevated. He is to report to office if he experiences increased abdominal pain.   All other labs are stable.   He is to keep follow up appointment as scheduled.    Thank you.

## 2019-04-04 NOTE — Telephone Encounter (Signed)
Patient notified and will pick up medication  

## 2019-04-14 ENCOUNTER — Telehealth: Payer: Self-pay

## 2019-04-14 NOTE — Telephone Encounter (Signed)
Called patient and he sates he is still having pain with hernia and it is bothering him to walk sometimes. He is asking if a CT can be ordered? Please advise. Thanks!

## 2019-04-15 ENCOUNTER — Other Ambulatory Visit: Payer: Self-pay

## 2019-04-15 ENCOUNTER — Other Ambulatory Visit: Payer: Self-pay | Admitting: Family Medicine

## 2019-04-15 ENCOUNTER — Ambulatory Visit (HOSPITAL_COMMUNITY)
Admission: RE | Admit: 2019-04-15 | Discharge: 2019-04-15 | Disposition: A | Discharge status: Home / Self Care | Payer: Self-pay | Source: Ambulatory Visit | Attending: Family Medicine | Admitting: Family Medicine

## 2019-04-15 DIAGNOSIS — K4190 Unilateral femoral hernia, without obstruction or gangrene, not specified as recurrent: Secondary | ICD-10-CM | POA: Insufficient documentation

## 2019-04-15 DIAGNOSIS — R1031 Right lower quadrant pain: Secondary | ICD-10-CM | POA: Insufficient documentation

## 2019-04-15 MED ORDER — IOHEXOL 300 MG/ML  SOLN
30.0000 mL | Freq: Once | INTRAMUSCULAR | Status: AC | PRN
  Administered 2019-04-15: 30 mL via ORAL

## 2019-04-15 NOTE — Telephone Encounter (Signed)
Called and inform pt of referral. 

## 2019-04-21 ENCOUNTER — Telehealth: Payer: Self-pay

## 2019-04-25 ENCOUNTER — Telehealth: Payer: Self-pay

## 2019-04-25 NOTE — Telephone Encounter (Signed)
Pt called  Wanting to know the results of his ct scan that was done on 04/15/19. Pt would like a call back with results.

## 2019-04-27 ENCOUNTER — Ambulatory Visit: Payer: Self-pay | Admitting: Family Medicine

## 2019-04-28 NOTE — Telephone Encounter (Signed)
error 

## 2019-05-30 ENCOUNTER — Other Ambulatory Visit: Payer: Self-pay

## 2019-05-30 ENCOUNTER — Encounter: Payer: Self-pay | Admitting: Family Medicine

## 2019-05-30 ENCOUNTER — Ambulatory Visit (INDEPENDENT_AMBULATORY_CARE_PROVIDER_SITE_OTHER): Payer: Self-pay | Admitting: Family Medicine

## 2019-05-30 VITALS — BP 128/58 | HR 68 | Temp 97.8°F | Ht 66.0 in | Wt 202.0 lb

## 2019-05-30 DIAGNOSIS — R109 Unspecified abdominal pain: Secondary | ICD-10-CM

## 2019-05-30 DIAGNOSIS — A048 Other specified bacterial intestinal infections: Secondary | ICD-10-CM

## 2019-05-30 DIAGNOSIS — K219 Gastro-esophageal reflux disease without esophagitis: Secondary | ICD-10-CM

## 2019-05-30 DIAGNOSIS — Z09 Encounter for follow-up examination after completed treatment for conditions other than malignant neoplasm: Secondary | ICD-10-CM

## 2019-05-30 DIAGNOSIS — R1031 Right lower quadrant pain: Secondary | ICD-10-CM

## 2019-05-30 DIAGNOSIS — K4190 Unilateral femoral hernia, without obstruction or gangrene, not specified as recurrent: Secondary | ICD-10-CM

## 2019-05-30 LAB — POCT URINALYSIS DIPSTICK
Bilirubin, UA: NEGATIVE
Glucose, UA: NEGATIVE
Ketones, UA: NEGATIVE
Leukocytes, UA: NEGATIVE
Nitrite, UA: NEGATIVE
Protein, UA: POSITIVE — AB
Spec Grav, UA: >=1.03 — AB (ref 1.010–1.025)
Urobilinogen, UA: 1 E.U./dL
pH, UA: 6.5 (ref 5.0–8.0)

## 2019-05-30 NOTE — Progress Notes (Signed)
[facility]   Established Patient Office Visit  Subjective:  Patient ID: Donald Landry, male    DOB: 08-Jun-1971  Age: 48 y.o. MRN: [id number]  CC:  Chief Complaint  Patient presents with  . Follow-up    2 mth follow up rt, GERD, groin pain, rt side femoral henia    HPI Donald Landry is a 48 year old male who presents for Follow Up today.   Past Medical History:  Diagnosis Date  . Back pain   . H. pylori infection 03/2019   Current Status: Since his last office visit, he continues to have moderate right lower abdominal pain. He states that recently he had had frequent right femoral swelling, pain and discomfort, in which he discontinues previous activity. He states that swelling eventually subsides. He denies fevers, chills, fatigue, recent infections, weight loss, and night sweats. He has not had any headaches, visual changes, dizziness, and falls. No chest pain, heart palpitations, cough and shortness of breath reported. No reports of GI problems such as nausea, vomiting, diarrhea, and constipation. He has no reports of blood in stools, dysuria and hematuria. No depression or anxiety is moderated today r/t his right hernia pain. He denies pain today.   No past surgical history on file.  Family History  Problem Relation Age of Onset  . Diabetes Sister   . Diabetes Sister     Social History   Socioeconomic History  . Marital status: Married    Spouse name: Not on file  . Number of children: Not on file  . Years of education: Not on file  . Highest education level: Not on file  Occupational History  . Not on file  Social Needs  . Financial resource strain: Not on file  . Food insecurity    Worry: Not on file    Inability: Not on file  . Transportation needs    Medical: Not on file    Non-medical: Not on file  Tobacco Use  . Smoking status: Never Smoker  . Smokeless tobacco: Never Used  Substance and Sexual  Activity  . Alcohol use: Yes  . Drug use: Never  . Sexual activity: Not on file  Lifestyle  . Physical activity    Days per week: Not on file    Minutes per session: Not on file  . Stress: Not on file  Relationships  . Social Musicianconnections    Talks on phone: Not on file    Gets together: Not on file    Attends religious service: Not on file    Active member of club or organization: Not on file    Attends meetings of clubs or organizations: Not on file    Relationship status: Not on file  . Intimate partner violence    Fear of current or ex partner: Not on file    Emotionally abused: Not on file    Physically abused: Not on file    Forced sexual activity: Not on file  Other Topics Concern  . Not on file  Social History Narrative  . Not on file    Outpatient Medications Prior to Visit  Medication Sig Dispense Refill  . amoxicillin (AMOXIL) 500 MG tablet Take 2 capsules by mouth, 2 times a day X 14 days. (Patient not taking: Reported on 05/30/2019) 56 tablet 0  . ibuprofen (ADVIL) 800 MG tablet Take 1 tablet (800 mg total) by mouth every 8 (eight) hours as needed. (  Patient not taking: Reported on 05/30/2019) 30 tablet 3  . omeprazole (PRILOSEC) 20 MG capsule Take 1 capsule (20 mg total) by mouth daily for 14 days. 14 capsule 0   No facility-administered medications prior to visit.     No Known Allergies  ROS Review of Systems  Constitutional: Negative.   HENT: Negative.   Eyes: Negative.   Respiratory: Negative.   Cardiovascular: Negative.   Gastrointestinal: Positive for abdominal pain (chronic lower abdominal pain).  Endocrine: Negative.   Genitourinary: Negative.   Musculoskeletal: Negative.   Skin: Negative.   Allergic/Immunologic: Negative.   Neurological: Negative.   Hematological: Negative.   Psychiatric/Behavioral: Negative.       Objective:    Physical Exam  Constitutional: He is oriented to person, place, and time. He appears well-developed and  well-nourished.  HENT:  Head: Normocephalic and atraumatic.  Eyes: Conjunctivae are normal.  Neck: Normal range of motion. Neck supple.  Cardiovascular: Normal rate, regular rhythm, normal heart sounds and intact distal pulses.  Pulmonary/Chest: Effort normal and breath sounds normal.  Abdominal: Soft. Bowel sounds are normal. He exhibits distension (mild lower right abdominal pain). There is abdominal tenderness (lower right abdominal pain).  Musculoskeletal: Normal range of motion.  Neurological: He is alert and oriented to person, place, and time. He has normal reflexes.  Skin: Skin is warm and dry.  Psychiatric: He has a normal mood and affect. His behavior is normal. Judgment and thought content normal.  Nursing note and vitals reviewed.  BP (!) 128/58 (BP Location: Left Arm, Patient Position: Sitting, Cuff Size: Normal)   Pulse 68   Temp 97.8 F (36.6 C)   Ht  (1.676 m)   Wt 202 lb (91.6 kg)   SpO2 100%   BMI 32.60 kg/m  Wt Readings from Last 3 Encounters:  05/30/19 202 lb (91.6 kg)  03/28/19 201 lb 12.8 oz (91.5 kg)  01/25/19 192 lb (87.1 kg)     Health Maintenance Due  Topic Date Due  . INFLUENZA VACCINE  03/26/2019    There are no preventive care reminders to display for this patient.  Lab Results  Component Value Date   TSH 2.110 01/25/2019   Lab Results  Component Value Date   WBC 6.1 01/25/2019   HGB 16.4 01/25/2019   HCT 46.8 01/25/2019   MCV 95 01/25/2019   PLT 250 01/25/2019   Lab Results  Component Value Date   NA 143 01/25/2019   K 4.3 01/25/2019   CO2 25 01/25/2019   GLUCOSE 97 01/25/2019   BUN 8 01/25/2019   CREATININE 0.98 01/25/2019   BILITOT 0.5 01/25/2019   ALKPHOS 66 01/25/2019   AST 20 01/25/2019   ALT 16 01/25/2019   PROT 7.1 01/25/2019   ALBUMIN 4.7 01/25/2019   CALCIUM 9.1 01/25/2019   Lab Results  Component Value Date   CHOL 145 01/25/2019   Lab Results  Component Value Date   HDL 52 01/25/2019   Lab Results   Component Value Date   LDLCALC 83 01/25/2019   Lab Results  Component Value Date   TRIG 52 01/25/2019   Lab Results  Component Value Date   CHOLHDL 2.8 01/25/2019   Lab Results  Component Value Date   HGBA1C 5.1 01/25/2019   Assessment & Plan:   1. Femoral hernia of right side We will refer patient for possible hernia surgery. Patient is currently uninsured and is informed about application for [facility] Financial Assistance [facility] Card), with possible assistance  with co-pays.   - Ambulatory referral to General Surgery  2. Right lower quadrant abdominal pain - POCT urinalysis dipstick - Ambulatory referral to General Surgery  3. Abdominal pain increased with position change  4. Right groin pain Increased frequency of pain and swelling.   5. H. pylori infection Previously tested positive for H. Pylori Infection and failed initial treatment. We will treat again today with different regimen. Monitor.  - clarithromycin (BIAXIN) 500 MG tablet; Take 1 tablet (500 mg total) by mouth 2 (two) times daily for 14 days.  Dispense: 28 tablet; Refill: 0 - omeprazole (PRILOSEC) 20 MG capsule; Take 1 capsule (20 mg total) by mouth 2 (two) times daily before a meal for 14 days.  Dispense: 28 capsule; Refill: 0 - amoxicillin (AMOXIL) 500 MG tablet; Take 2 capsules by mouth, (Total= 1,000 mg), 2 times a day.  Dispense: 56 tablet; Refill: 0 - tinidazole (TINDAMAX) 500 MG tablet; Take 1 tablet (500 mg total) by mouth 2 (two) times daily for 14 days.  Dispense: 28 tablet; Refill: 0  6. Gastroesophageal reflux disease without esophagitis  7. Follow up He will follow up in 3 months  Meds ordered this encounter  Medications  . clarithromycin (BIAXIN) 500 MG tablet    Sig: Take 1 tablet (500 mg total) by mouth 2 (two) times daily for 14 days.    Dispense:  28 tablet    Refill:  0  . omeprazole (PRILOSEC) 20 MG capsule    Sig: Take 1 capsule (20 mg total) by mouth 2 (two) times daily  before a meal for 14 days.    Dispense:  28 capsule    Refill:  0  . amoxicillin (AMOXIL) 500 MG tablet    Sig: Take 2 capsules by mouth, (Total= 1,000 mg), 2 times a day.    Dispense:  56 tablet    Refill:  0  . tinidazole (TINDAMAX) 500 MG tablet    Sig: Take 1 tablet (500 mg total) by mouth 2 (two) times daily for 14 days.    Dispense:  28 tablet    Refill:  0    Orders Placed This Encounter  Procedures  . Ambulatory referral to General Surgery  . POCT urinalysis dipstick     Referral Orders     Ambulatory referral to General Surgery   Raliegh Ip,  MSN, FNP-BC [facility] [address] [place], [place] [address] [phone]- fax   Problem List Items Addressed This Visit      Digestive   Gastroesophageal reflux disease without esophagitis   Relevant Medications   omeprazole (PRILOSEC) 20 MG capsule     Other   Femoral hernia of right side - Primary   Relevant Orders   Ambulatory referral to General Surgery   Right groin pain    Other Visit Diagnoses    Right lower quadrant abdominal pain       Relevant Orders   POCT urinalysis dipstick (Completed)   Ambulatory referral to General Surgery   Abdominal pain increased with position change       H. pylori infection       Relevant Medications   clarithromycin (BIAXIN) 500 MG tablet   omeprazole (PRILOSEC) 20 MG capsule   amoxicillin (AMOXIL) 500 MG tablet   tinidazole (TINDAMAX) 500 MG tablet   Follow up          Meds ordered this encounter  Medications  . clarithromycin (  BIAXIN) 500 MG tablet    Sig: Take 1 tablet (500 mg total) by mouth 2 (two) times daily for 14 days.    Dispense:  28 tablet    Refill:  0  . omeprazole (PRILOSEC) 20 MG capsule    Sig: Take 1 capsule (20 mg total) by mouth 2 (two) times daily before a meal for 14 days.    Dispense:  28 capsule    Refill:  0  . amoxicillin (AMOXIL) 500 MG tablet     Sig: Take 2 capsules by mouth, (Total= 1,000 mg), 2 times a day.    Dispense:  56 tablet    Refill:  0  . tinidazole (TINDAMAX) 500 MG tablet    Sig: Take 1 tablet (500 mg total) by mouth 2 (two) times daily for 14 days.    Dispense:  28 tablet    Refill:  0    Follow-up: Return in about 3 months (around 08/30/2019).    Azzie Glatter, FNP

## 2019-05-31 ENCOUNTER — Telehealth: Payer: Self-pay

## 2019-05-31 DIAGNOSIS — R109 Unspecified abdominal pain: Secondary | ICD-10-CM | POA: Insufficient documentation

## 2019-05-31 DIAGNOSIS — R1031 Right lower quadrant pain: Secondary | ICD-10-CM | POA: Insufficient documentation

## 2019-05-31 DIAGNOSIS — A048 Other specified bacterial intestinal infections: Secondary | ICD-10-CM | POA: Insufficient documentation

## 2019-05-31 MED ORDER — TINIDAZOLE 500 MG PO TABS: 500.0000 mg | ORAL_TABLET | Freq: Two times a day (BID) | ORAL | 0 refills | Status: AC

## 2019-05-31 MED ORDER — OMEPRAZOLE 20 MG PO CPDR: 20.0000 mg | DELAYED_RELEASE_CAPSULE | Freq: Two times a day (BID) | ORAL | 0 refills | Status: DC

## 2019-05-31 MED ORDER — CLARITHROMYCIN 500 MG PO TABS: 500.0000 mg | ORAL_TABLET | Freq: Two times a day (BID) | ORAL | 0 refills | Status: AC

## 2019-05-31 MED ORDER — AMOXICILLIN 500 MG PO TABS: ORAL_TABLET | ORAL | 0 refills | Status: DC

## 2019-05-31 NOTE — Telephone Encounter (Signed)
Called and spoke with patient, advised that he was positive for H Pylori. Advised that he has been prescribed 4 new rx's to take as directed for 14 days to alleviate infection. Advised that if treatment is not effective this time he may have to see a specialist. Thanks!

## 2019-05-31 NOTE — Telephone Encounter (Signed)
-----   Message from Azzie Glatter, [place] sent at 05/31/2019  5:11 AM EDT ----- Regarding: "New Rxs" 4 new Rxs sent to pharmacy today, for the treatment of H. Pylori (Stomach Infection). Patient is take as prescribed X 14 days in order to alleviate infection. He is take with food. Patient was previously treated for this infection, but it was not effective. We will refer him to GI if this treatment is not effective.   Patient also should apply for [place] asap for possible assistance with co-pays for future Hernia Surgery. He can pick up application from our office or from [place].   Thank you, Mickel Baas!

## 2019-08-30 ENCOUNTER — Other Ambulatory Visit: Payer: Self-pay

## 2019-08-30 ENCOUNTER — Encounter: Payer: Self-pay | Admitting: Family Medicine

## 2019-08-30 ENCOUNTER — Ambulatory Visit (INDEPENDENT_AMBULATORY_CARE_PROVIDER_SITE_OTHER): Payer: Self-pay | Admitting: Family Medicine

## 2019-08-30 VITALS — BP 106/60 | HR 71 | Temp 98.4°F | Ht 66.0 in | Wt 205.6 lb

## 2019-08-30 DIAGNOSIS — Z8619 Personal history of other infectious and parasitic diseases: Secondary | ICD-10-CM

## 2019-08-30 DIAGNOSIS — K219 Gastro-esophageal reflux disease without esophagitis: Secondary | ICD-10-CM

## 2019-08-30 DIAGNOSIS — R8290 Unspecified abnormal findings in urine: Secondary | ICD-10-CM

## 2019-08-30 DIAGNOSIS — Z09 Encounter for follow-up examination after completed treatment for conditions other than malignant neoplasm: Secondary | ICD-10-CM

## 2019-08-30 DIAGNOSIS — R1031 Right lower quadrant pain: Secondary | ICD-10-CM

## 2019-08-30 LAB — POCT URINALYSIS DIPSTICK
Bilirubin, UA: NEGATIVE
Glucose, UA: NEGATIVE
Ketones, UA: NEGATIVE
Leukocytes, UA: NEGATIVE
Nitrite, UA: NEGATIVE
Protein, UA: NEGATIVE
Spec Grav, UA: >=1.03 — AB (ref 1.010–1.025)
Urobilinogen, UA: 0.2 E.U./dL
pH, UA: 6.5 (ref 5.0–8.0)

## 2019-08-30 NOTE — Progress Notes (Signed)
Patient [facility]   Established Patient Office Visit  Subjective:  Patient ID: Donald Landry, male    DOB: 1971-02-24  Age: 49 y.o. MRN: [id number]  CC:  Chief Complaint  Patient presents with  . Follow-up    groin, abd pain follow up    HPI Donald Landry is a 49 year old male who presents for Follow Up today.   Past Medical History:  Diagnosis Date  . Back pain   . H. pylori infection 03/2019   Current Status: Since his last office visit, he continues to have c/o right groin pain and discomfort. Ultrasound Pelvis 01/2019 and did not indicate any definite hernia. Patient was advised to continue to monitor. He is currently in the process of scheduling for possible surgery. He also has chronic abdominal pain, which he has began to take Ginger for mild relief.  Recent diagnosis of H. Pylori Infection. Treated X 2 not effective. He is currently not taking PPIs, only occasional natural ginger for mild relief of symptoms. He denies fevers, chills, fatigue, recent infections, weight loss, and night sweats. He has not had any headaches, visual changes, dizziness, and falls. No chest pain, heart palpitations, cough and shortness of breath reported. No reports of GI problems such as nausea, vomiting, diarrhea, and constipation. He has no reports of blood in stools, dysuria and hematuria. His anxiety is moderate today r/t his health status and lack of insurance coverage. He denies suicidal ideations, homicidal ideations, or auditory hallucinations.   History reviewed. No pertinent surgical history.  Family History  Problem Relation Age of Onset  . Diabetes Sister   . Diabetes Sister     Social History   Socioeconomic History  . Marital status: Married    Spouse name: Not on file  . Number of children: Not on file  . Years of education: Not on file  . Highest education level: Not on file  Occupational History  . Not on file  Tobacco  Use  . Smoking status: Never Smoker  . Smokeless tobacco: Never Used  Substance and Sexual Activity  . Alcohol use: Yes  . Drug use: Never  . Sexual activity: Yes  Other Topics Concern  . Not on file  Social History Narrative  . Not on file   Social Determinants of Health   Financial Resource Strain:   . Difficulty of Paying Living Expenses: Not on file  Food Insecurity:   . Worried About Charity fundraiser in the Last Year: Not on file  . Ran Out of Food in the Last Year: Not on file  Transportation Needs:   . Lack of Transportation (Medical): Not on file  . Lack of Transportation (Non-Medical): Not on file  Physical Activity:   . Days of Exercise per Week: Not on file  . Minutes of Exercise per Session: Not on file  Stress:   . Feeling of Stress : Not on file  Social Connections:   . Frequency of Communication with Friends and Family: Not on file  . Frequency of Social Gatherings with Friends and Family: Not on file  . Attends Religious Services: Not on file  . Active Member of Clubs or Organizations: Not on file  . Attends Archivist Meetings: Not on file  . Marital Status: Not on file  Intimate Partner Violence:   . Fear of Current or Ex-Partner: Not on file  . Emotionally Abused: Not on file  .  Physically Abused: Not on file  . Sexually Abused: Not on file    Outpatient Medications Prior to Visit  Medication Sig Dispense Refill  . amoxicillin (AMOXIL) 500 MG tablet Take 2 capsules by mouth, (Total= 1,000 mg), 2 times a day. 56 tablet 0  . omeprazole (PRILOSEC) 20 MG capsule Take 1 capsule (20 mg total) by mouth 2 (two) times daily before a meal for 14 days. 28 capsule 0   No facility-administered medications prior to visit.    No Known Allergies  ROS Review of Systems  Constitutional: Negative.   HENT: Negative.   Eyes: Negative.   Respiratory: Negative.   Cardiovascular: Negative.   Gastrointestinal: Negative.   Endocrine: Negative.    Genitourinary: Negative.   Musculoskeletal: Negative.        Right lower abdominal/groin pain   Skin: Negative.   Allergic/Immunologic: Negative.   Neurological: Negative.   Hematological: Negative.   Psychiatric/Behavioral: Negative.       Objective:    Physical Exam  Constitutional: He is oriented to person, place, and time. He appears well-developed and well-nourished.  HENT:  Head: Normocephalic and atraumatic.  Eyes: Conjunctivae are normal.  Cardiovascular: Normal rate, regular rhythm, normal heart sounds and intact distal pulses.  Pulmonary/Chest: Effort normal and breath sounds normal.  Abdominal: Soft. Bowel sounds are normal.  Musculoskeletal:        General: Normal range of motion.     Cervical back: Normal range of motion and neck supple.  Neurological: He is alert and oriented to person, place, and time. He has normal reflexes.  Skin: Skin is warm and dry.  Psychiatric: He has a normal mood and affect. His behavior is normal. Judgment and thought content normal.  Nursing note and vitals reviewed.   BP 106/60   Pulse 71   Temp 98.4 F (36.9 C) (Oral)   Ht 5\' 6"  (1.676 m)   Wt 205 lb 9.6 oz (93.3 kg)   SpO2 99%   BMI 33.18 kg/m  Wt Readings from Last 3 Encounters:  08/30/19 205 lb 9.6 oz (93.3 kg)  05/30/19 202 lb (91.6 kg)  03/28/19 201 lb 12.8 oz (91.5 kg)     Health Maintenance Due  Topic Date Due  . INFLUENZA VACCINE  03/26/2019    There are no preventive care reminders to display for this patient.  Lab Results  Component Value Date   TSH 2.110 01/25/2019   Lab Results  Component Value Date   WBC 6.1 01/25/2019   HGB 16.4 01/25/2019   HCT 46.8 01/25/2019   MCV 95 01/25/2019   PLT 250 01/25/2019   Lab Results  Component Value Date   NA 143 01/25/2019   K 4.3 01/25/2019   CO2 25 01/25/2019   GLUCOSE 97 01/25/2019   BUN 8 01/25/2019   CREATININE 0.98 01/25/2019   BILITOT 0.5 01/25/2019   ALKPHOS 66 01/25/2019   AST 20 01/25/2019    ALT 16 01/25/2019   PROT 7.1 01/25/2019   ALBUMIN 4.7 01/25/2019   CALCIUM 9.1 01/25/2019   Lab Results  Component Value Date   CHOL 145 01/25/2019   Lab Results  Component Value Date   HDL 52 01/25/2019   Lab Results  Component Value Date   LDLCALC 83 01/25/2019   Lab Results  Component Value Date   TRIG 52 01/25/2019   Lab Results  Component Value Date   CHOLHDL 2.8 01/25/2019   Lab Results  Component Value Date   HGBA1C 5.1 01/25/2019  Assessment & Plan:   1. Right groin pain - Urinalysis Dipstick  2. Gastroesophageal reflux disease without esophagitis - H. pylori breath test  3. Abnormal urinalysis Results are pending.  - Urine Culture  4. Right lower quadrant abdominal pain Increased on exertion.   5. History of Helicobacter pylori infection  We will re-assess for infection today. Recommended referral to GI, but patient has insurance restraints at this time. We will continue to monitor.   6. Follow up He will follow up in 2 months.   No orders of the defined types were placed in this encounter.   Orders Placed This Encounter  Procedures  . Urine Culture  . H. pylori breath test  . Urinalysis Dipstick   Referral Orders  No referral(s) requested today    Raliegh Ip,  MSN, FNP-BC [facility] Patient [facility] [address] [place], [place] [address] [phone]- fax   Problem List Items Addressed This Visit      Digestive   Gastroesophageal reflux disease without esophagitis   Relevant Orders   H. pylori breath test     Other   H. pylori infection   Right groin pain - Primary   Relevant Orders   Urinalysis Dipstick (Completed)   Right lower quadrant abdominal pain    Other Visit Diagnoses    Abnormal urinalysis       Relevant Orders   Urine Culture   Follow up          No orders of the defined types were placed in this encounter.   Follow-up:  Return in about 2 months (around 10/28/2019).    Kallie Locks, FNP

## 2019-09-01 LAB — URINE CULTURE: Organism ID, Bacteria: NO GROWTH

## 2019-09-01 LAB — H. PYLORI BREATH TEST: H pylori Breath Test: NEGATIVE

## 2019-10-28 ENCOUNTER — Ambulatory Visit: Payer: Self-pay | Admitting: Family Medicine

## 2019-11-07 ENCOUNTER — Telehealth: Payer: Self-pay | Admitting: Family Medicine

## 2019-11-07 NOTE — Telephone Encounter (Signed)
Pt was called and reminded of there appointment 

## 2019-11-08 ENCOUNTER — Encounter: Payer: Self-pay | Admitting: Family Medicine

## 2019-11-08 ENCOUNTER — Other Ambulatory Visit: Payer: Self-pay

## 2019-11-08 ENCOUNTER — Ambulatory Visit (INDEPENDENT_AMBULATORY_CARE_PROVIDER_SITE_OTHER): Payer: Self-pay | Admitting: Family Medicine

## 2019-11-08 VITALS — BP 118/77 | HR 78 | Temp 98.0°F | Ht 66.0 in | Wt 215.8 lb

## 2019-11-08 DIAGNOSIS — R1031 Right lower quadrant pain: Secondary | ICD-10-CM

## 2019-11-08 DIAGNOSIS — Z1211 Encounter for screening for malignant neoplasm of colon: Secondary | ICD-10-CM

## 2019-11-08 DIAGNOSIS — K219 Gastro-esophageal reflux disease without esophagitis: Secondary | ICD-10-CM

## 2019-11-08 DIAGNOSIS — Z8619 Personal history of other infectious and parasitic diseases: Secondary | ICD-10-CM

## 2019-11-08 DIAGNOSIS — Z09 Encounter for follow-up examination after completed treatment for conditions other than malignant neoplasm: Secondary | ICD-10-CM

## 2019-11-08 LAB — POCT URINALYSIS DIPSTICK
Bilirubin, UA: NEGATIVE
Glucose, UA: NEGATIVE
Ketones, UA: NEGATIVE
Leukocytes, UA: NEGATIVE
Nitrite, UA: NEGATIVE
Protein, UA: POSITIVE — AB
Spec Grav, UA: >=1.03 — AB (ref 1.010–1.025)
Urobilinogen, UA: 0.2 E.U./dL
pH, UA: 6.5 (ref 5.0–8.0)

## 2019-11-08 NOTE — Progress Notes (Signed)
Integrated Behavioral Health Case Management Referral Note  11/08/2019 Name: Donald Landry MRN: [id number] DOB: 03-10-1971 Donald Landry is a 49 y.o. year old male who sees Kallie Locks, FNP for primary care. LCSW was consulted to assess patient's access to community resources and assist the patient with Financial Difficulties related to lack of health insurance.  Assessment: Patient does not have insurance. He and his wife both work and patient indicated he was denied for Medicaid. Provided patient information on the health insurance Marketplace (Affordable Care Act) and [facility] Financial Assistance for existing medical bills.  Review of patient status, including review of consultants reports, relevant laboratory and other test results, and collaboration with appropriate care team members and the patient's provider was performed as part of comprehensive patient evaluation and provision of services.    SDOH (Social Determinants of Health) assessments performed: No    No outpatient encounter medications on file as of 11/08/2019.   No facility-administered encounter medications on file as of 11/08/2019.    Goals Addressed   None      Follow up Plan: 1. CSW available for additional questions as needed.   Abigail Butts, LCSW [facility] [phone]

## 2019-11-08 NOTE — Progress Notes (Signed)
[facility]   Established Patient Office Visit  Subjective:  Patient ID: Donald Landry, male    DOB: 06-27-1971  Age: 49 y.o. MRN: [id number]  CC:  Chief Complaint  Patient presents with  . Follow-up    HPI Donald Landry is a 49 year old male who presents for Follow Up today.  Past Medical History:  Diagnosis Date  . Back pain   . H. pylori infection 03/2019   Current Status: Since his last office visit, He is doing well with no complaints. He reports occasional acid reflux, but he continues to modify his diet and avoids foods which trigger symptoms. Hernia is also stable at this time. He is inquiring about colon cancer screening today. He denies fevers, chills, fatigue, recent infections, weight loss, and night sweats. He has not had any headaches, visual changes, dizziness, and falls. No chest pain, heart palpitations, cough and shortness of breath reported. No reports of GI problems such as nausea, vomiting, diarrhea, and constipation. He has no reports of blood in stools, dysuria and hematuria. No depression or anxiety reported today. He denies suicidal ideations, homicidal ideations, or auditory hallucinations. He denies pain today.   History reviewed. No pertinent surgical history.  Family History  Problem Relation Age of Onset  . Diabetes Sister   . Diabetes Sister     Social History   Socioeconomic History  . Marital status: Married    Spouse name: Not on file  . Number of children: Not on file  . Years of education: Not on file  . Highest education level: Not on file  Occupational History  . Not on file  Tobacco Use  . Smoking status: Never Smoker  . Smokeless tobacco: Never Used  Substance and Sexual Activity  . Alcohol use: Yes  . Drug use: Never  . Sexual activity: Yes  Other Topics Concern  . Not on file  Social History Narrative  . Not on file   Social Determinants of Health   Financial  Resource Strain:   . Difficulty of Paying Living Expenses:   Food Insecurity:   . Worried About Programme researcher, broadcasting/film/video in the Last Year:   . Barista in the Last Year:   Transportation Needs:   . Freight forwarder (Medical):   Marland Kitchen Lack of Transportation (Non-Medical):   Physical Activity:   . Days of Exercise per Week:   . Minutes of Exercise per Session:   Stress:   . Feeling of Stress :   Social Connections:   . Frequency of Communication with Friends and Family:   . Frequency of Social Gatherings with Friends and Family:   . Attends Religious Services:   . Active Member of Clubs or Organizations:   . Attends Banker Meetings:   Marland Kitchen Marital Status:   Intimate Partner Violence:   . Fear of Current or Ex-Partner:   . Emotionally Abused:   Marland Kitchen Physically Abused:   . Sexually Abused:     No outpatient medications prior to visit.   No facility-administered medications prior to visit.    No Known Allergies  ROS Review of Systems  Constitutional: Negative.   HENT: Negative.   Eyes: Negative.   Respiratory: Negative.   Cardiovascular: Negative.   Gastrointestinal: Negative.        Occasional acid reflux  Endocrine: Negative.   Genitourinary: Negative.   Musculoskeletal:       Occasional right  groin pain  Skin: Negative.   Allergic/Immunologic: Negative.   Neurological: Negative.   Hematological: Negative.   Psychiatric/Behavioral: Negative.     Objective:    Physical Exam  Constitutional: He is oriented to person, place, and time. He appears well-developed and well-nourished.  HENT:  Head: Normocephalic and atraumatic.  Eyes: Conjunctivae are normal.  Cardiovascular: Normal rate, regular rhythm, normal heart sounds and intact distal pulses.  Pulmonary/Chest: Effort normal and breath sounds normal.  Abdominal: Soft. Bowel sounds are normal.  Musculoskeletal:        General: Normal range of motion.     Cervical back: Normal range of motion and  neck supple.  Neurological: He is alert and oriented to person, place, and time. He has normal reflexes.  Skin: Skin is warm and dry.  Psychiatric: He has a normal mood and affect. His behavior is normal. Judgment and thought content normal.  Nursing note and vitals reviewed.   BP 118/77   Pulse 78   Temp 98 F (36.7 C) (Oral)   Ht 5\' 6"  (1.676 m)   Wt 215 lb 12.8 oz (97.9 kg)   SpO2 100%   BMI 34.83 kg/m  Wt Readings from Last 3 Encounters:  11/08/19 215 lb 12.8 oz (97.9 kg)  08/30/19 205 lb 9.6 oz (93.3 kg)  05/30/19 202 lb (91.6 kg)     Health Maintenance Due  Topic Date Due  . INFLUENZA VACCINE  Never done    There are no preventive care reminders to display for this patient.  Lab Results  Component Value Date   TSH 2.110 01/25/2019   Lab Results  Component Value Date   WBC 6.1 01/25/2019   HGB 16.4 01/25/2019   HCT 46.8 01/25/2019   MCV 95 01/25/2019   PLT 250 01/25/2019   Lab Results  Component Value Date   NA 143 01/25/2019   K 4.3 01/25/2019   CO2 25 01/25/2019   GLUCOSE 97 01/25/2019   BUN 8 01/25/2019   CREATININE 0.98 01/25/2019   BILITOT 0.5 01/25/2019   ALKPHOS 66 01/25/2019   AST 20 01/25/2019   ALT 16 01/25/2019   PROT 7.1 01/25/2019   ALBUMIN 4.7 01/25/2019   CALCIUM 9.1 01/25/2019   Lab Results  Component Value Date   CHOL 145 01/25/2019   Lab Results  Component Value Date   HDL 52 01/25/2019   Lab Results  Component Value Date   LDLCALC 83 01/25/2019   Lab Results  Component Value Date   TRIG 52 01/25/2019   Lab Results  Component Value Date   CHOLHDL 2.8 01/25/2019   Lab Results  Component Value Date   HGBA1C 5.1 01/25/2019      Assessment & Plan:   1. Right groin pain Stable at this time.   2. Gastroesophageal reflux disease without esophagitis  3. History of Helicobacter pylori infection  4. Screening for colon cancer Patient is currently uninsured. Encouraged him to apply for [facility]. Discussed benefits of CRC screening at length today. Information given on possible screening via [employer]. We will re-assess at next office visit.   5. Follow up He will follow up 06/2020. - POCT urinalysis dipstick  No orders of the defined types were placed in this encounter.   Orders Placed This Encounter  Procedures  . POCT urinalysis dipstick    Referral Orders  No referral(s) requested today    07/2020,  MSN, FNP-BC [facility] [place], [place] [address] [phone]- fax    Problem List Items Addressed This Visit      Digestive   Gastroesophageal reflux disease without esophagitis     Other   Right groin pain - Primary    Other Visit Diagnoses    History of Helicobacter pylori infection       Screening for colon cancer       Follow up       Relevant Orders   POCT urinalysis dipstick (Completed)      No orders of the defined types were placed in this encounter.   Follow-up: No follow-ups on file.    Azzie Glatter, FNP

## 2020-02-08 ENCOUNTER — Ambulatory Visit: Payer: Self-pay | Admitting: Family Medicine

## 2020-02-21 ENCOUNTER — Ambulatory Visit: Payer: Self-pay | Admitting: Family Medicine

## 2020-05-04 IMAGING — US US PELVIS LIMITED
1 series · 14 of 25 positions shown · non-contrast
Comparison: None.

CLINICAL DATA: Right groin pain and swelling.

EXAM:
LIMITED ULTRASOUND OF PELVIS
TECHNIQUE: Limited transabdominal ultrasound examination of the pelvis was
performed.

[Series 1: us pelvis limited · 30 acquisitions, 14 frames shown]
[im 1/30]
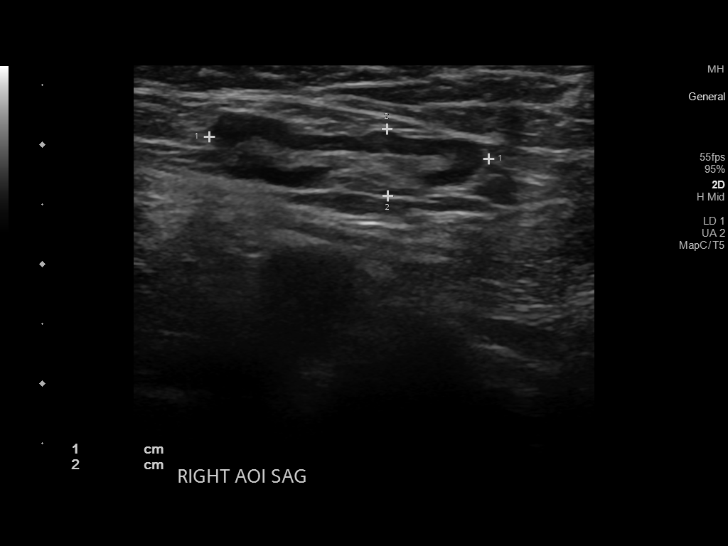
[im 3/30]
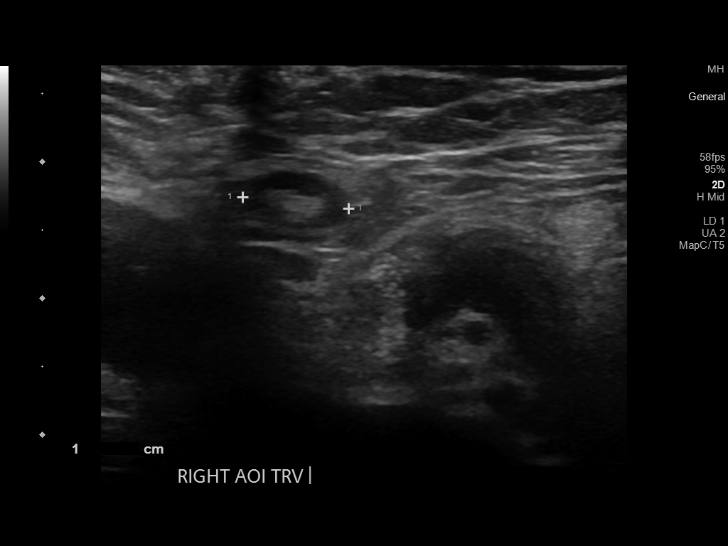
[im 5/30]
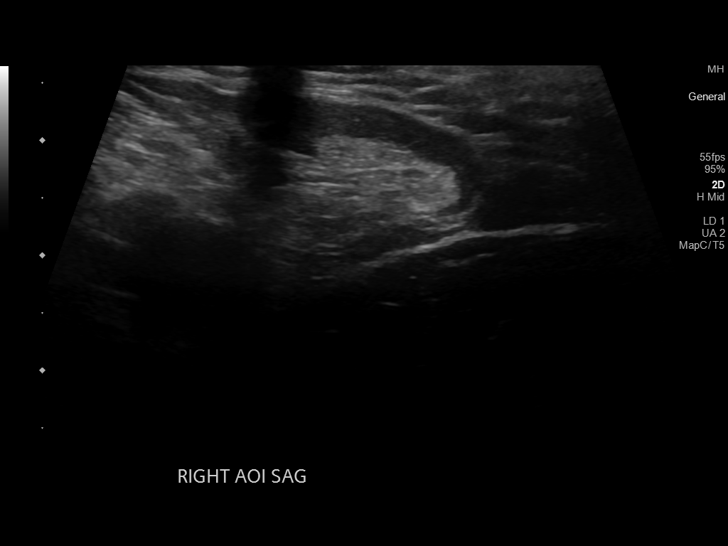
[im 8/30]
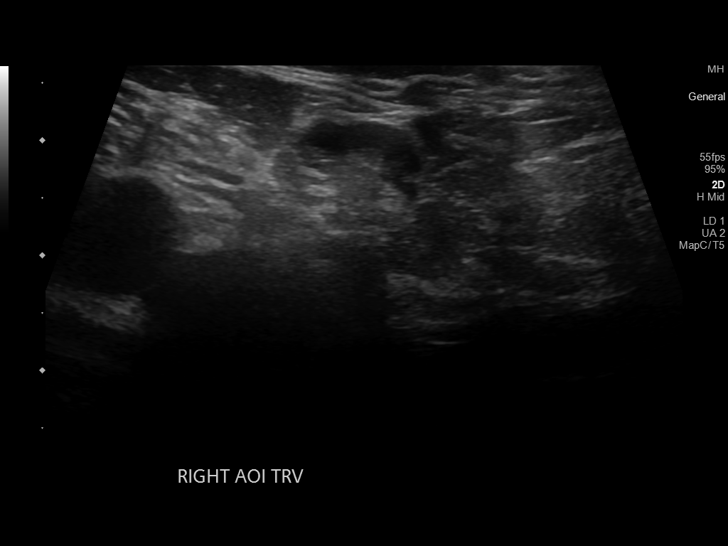
[im 10/30]
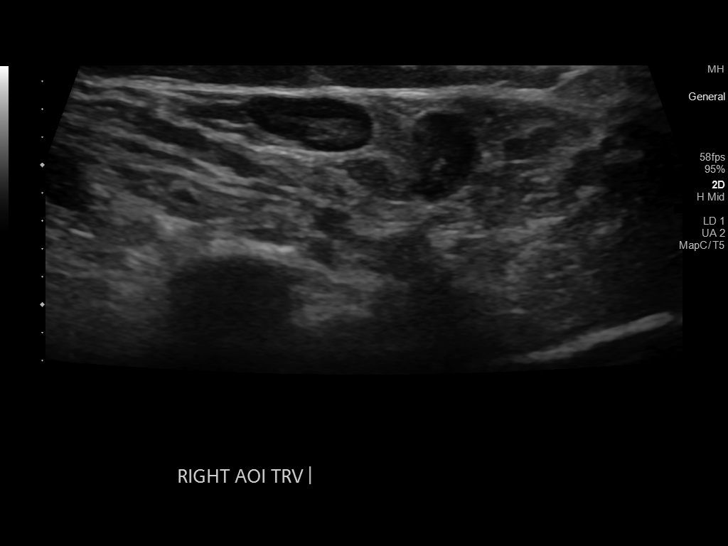
[im 11/30]
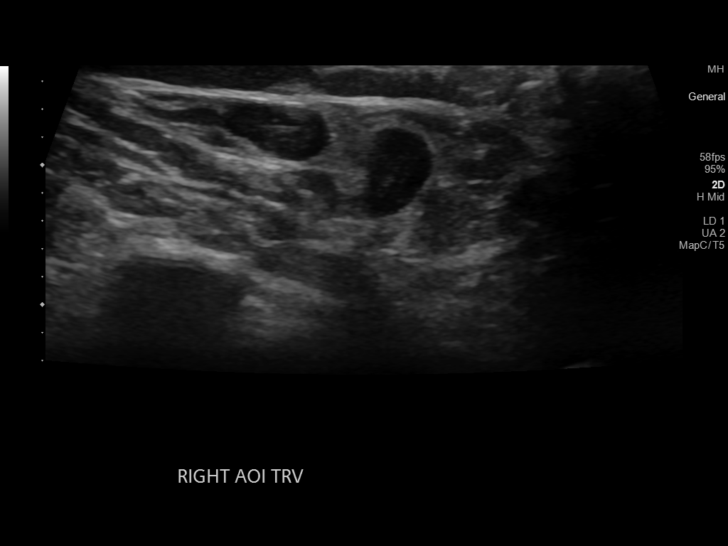
[im 14/30]
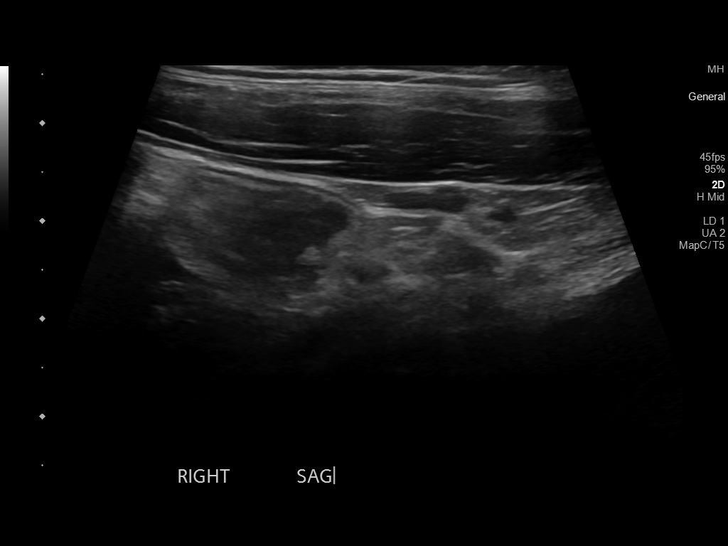
[im 16/30]
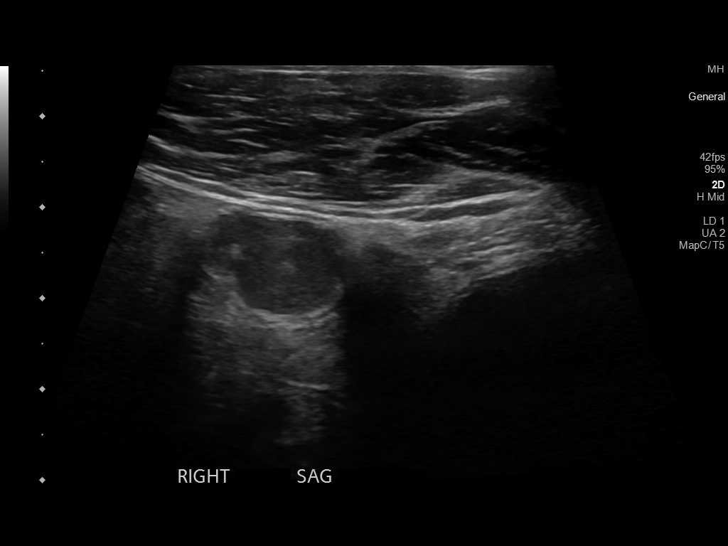
[im 19/30]
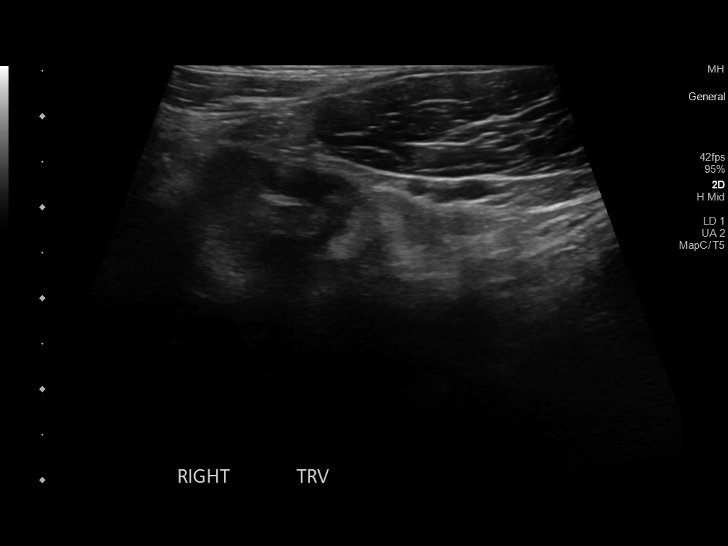
[im 20/30]
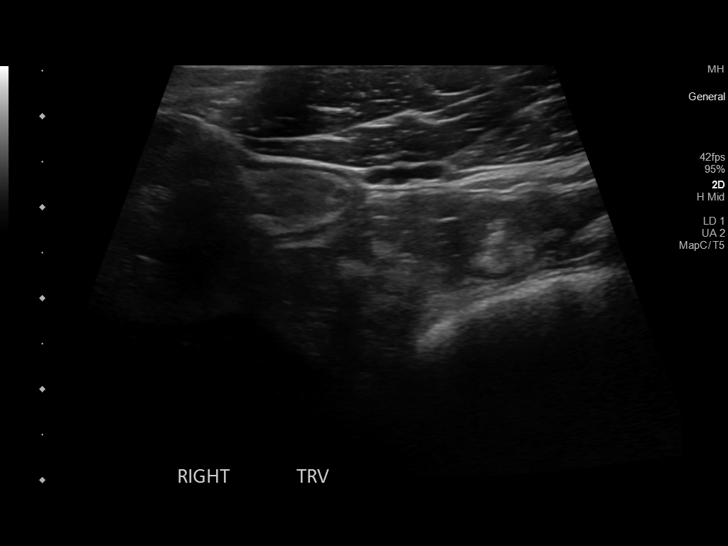
[im 22/30]
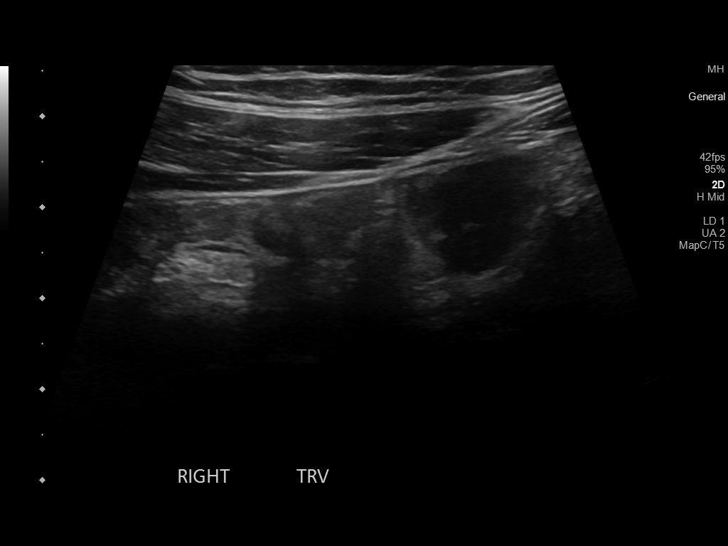
[im 25/30]
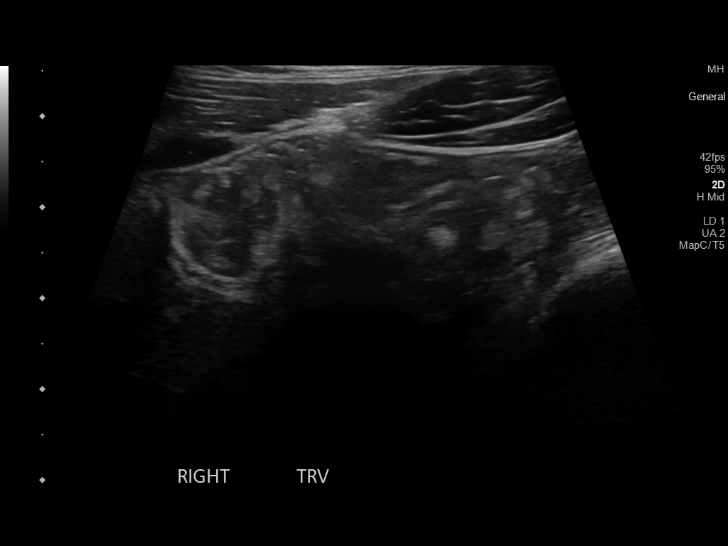
[im 27/30]
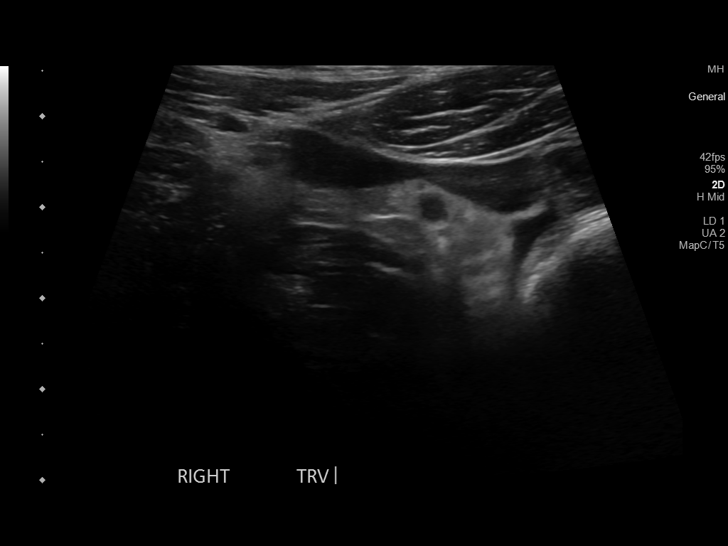
[im 30/30]
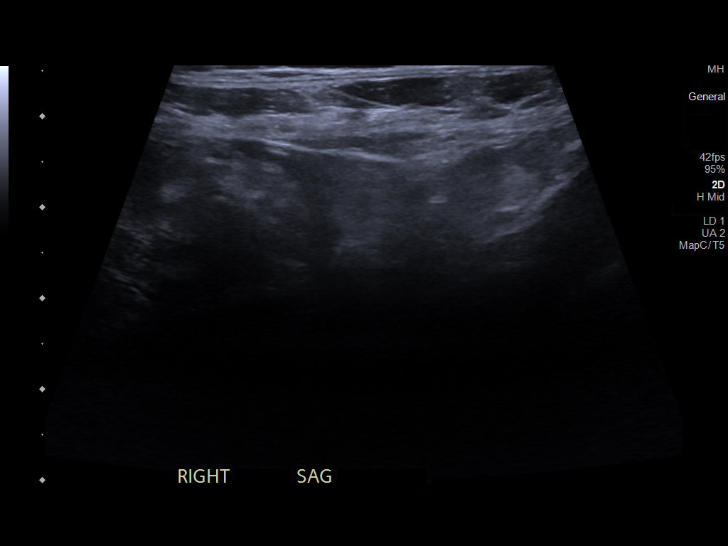

[14 of 25 positions shown; findings below may reference images not displayed]

FINDINGS: Limited sonographic evaluation was performed in the right inguinal
region. No definite hernia is noted. Two lymph nodes are noted, each
of which is less than 1 cm in minor axis with fatty hila consistent
with reactive or benign adenopathy. No significant fluid collection
is noted.
IMPRESSION: Mildly enlarged right inguinal lymph nodes are noted most consistent
with reactive or benign adenopathy. No definite hernia is noted.

## 2020-07-22 IMAGING — CT CT ABDOMEN AND PELVIS WITHOUT CONTRAST
2 of 4 series · 16 of 46 positions shown, 18 images · non-contrast
Comparison: None.

CLINICAL DATA: Right groin pain for 3 months

EXAM:
CT ABDOMEN AND PELVIS WITHOUT CONTRAST
TECHNIQUE: Multidetector CT imaging of the abdomen and pelvis was performed
following the standard protocol without IV contrast.

[Series 2: axial st · axial · 0.71mm/px · z∈[-591,-196]mm · 13 of 91 slices shown, 15 images]
[im 6/91  soft-tissue]
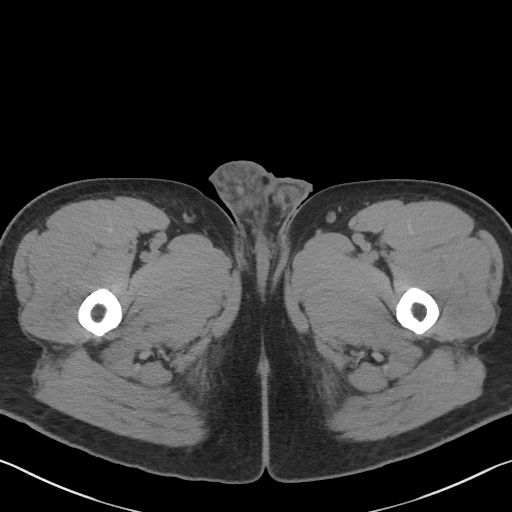
[im 6/91  bone]
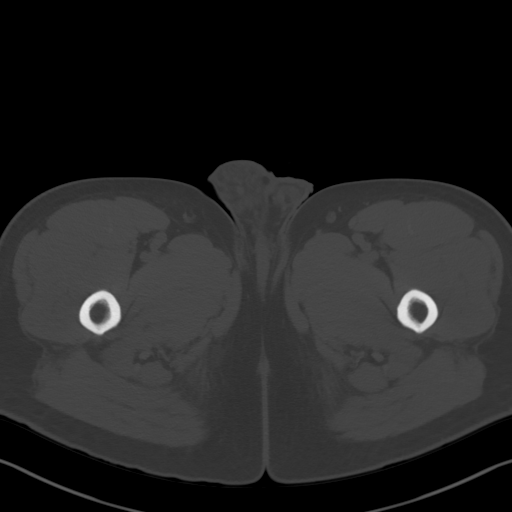
[im 11/91  soft-tissue]
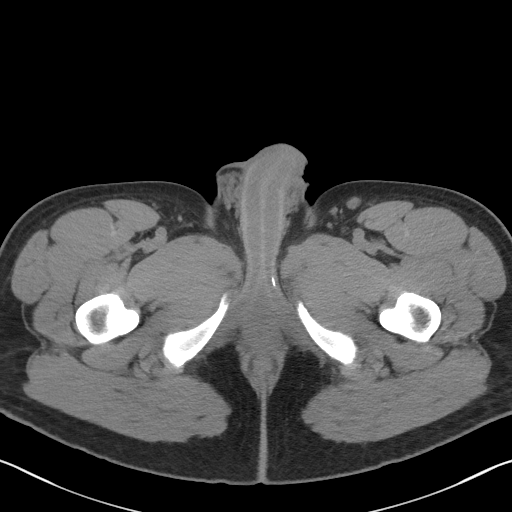
[im 22/91  soft-tissue]
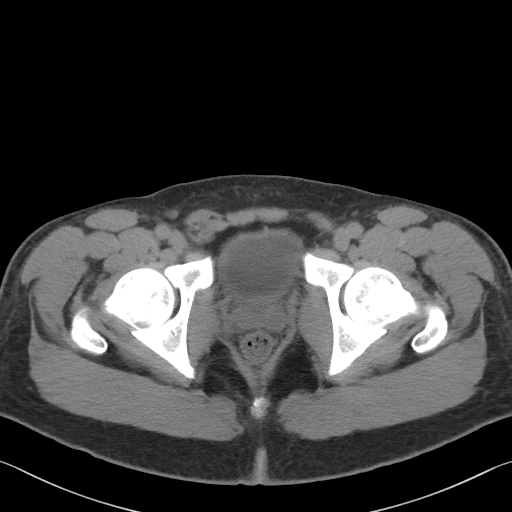
[im 27/91  soft-tissue]
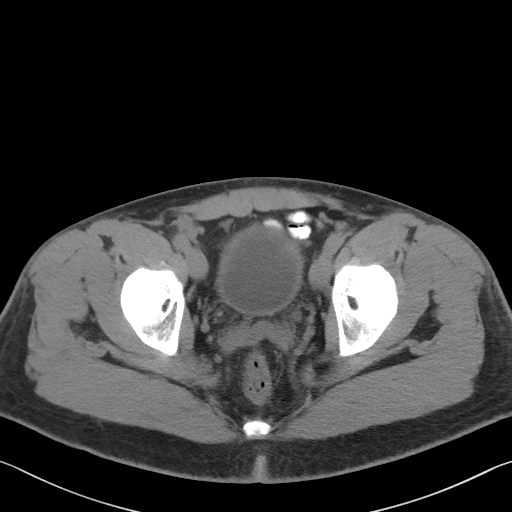
[im 32/91  soft-tissue]
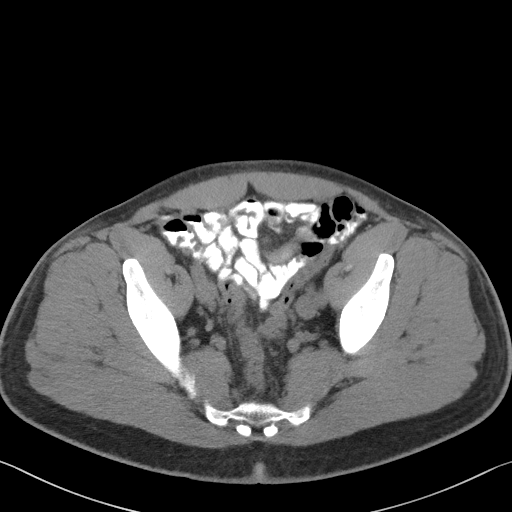
[im 38/91  soft-tissue]
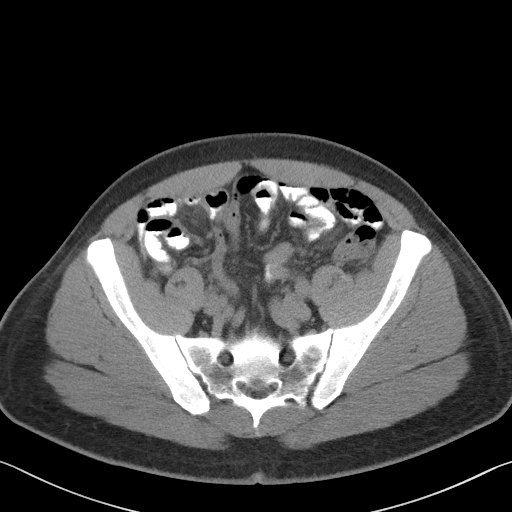
[im 48/91  soft-tissue]
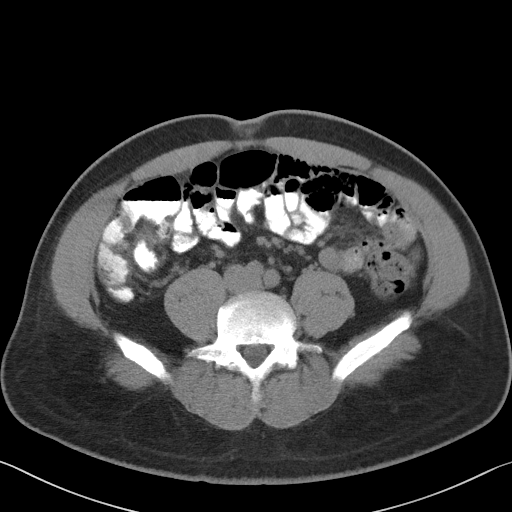
[im 53/91  soft-tissue]
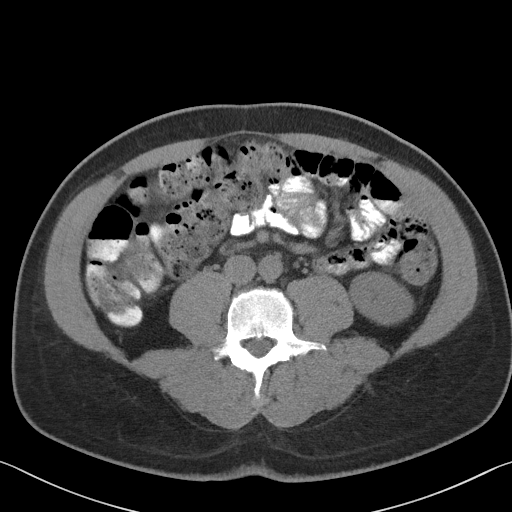
[im 59/91  soft-tissue]
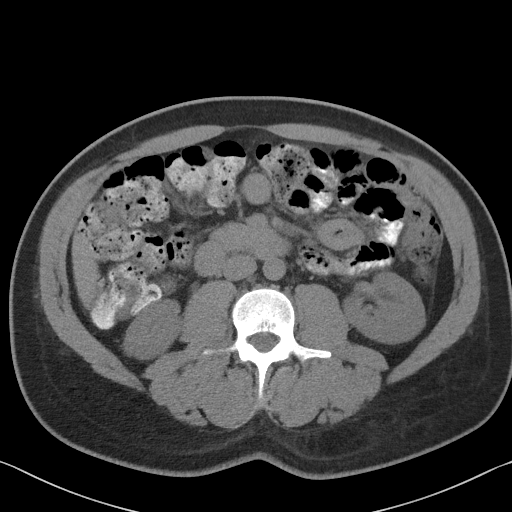
[im 59/91  bone]
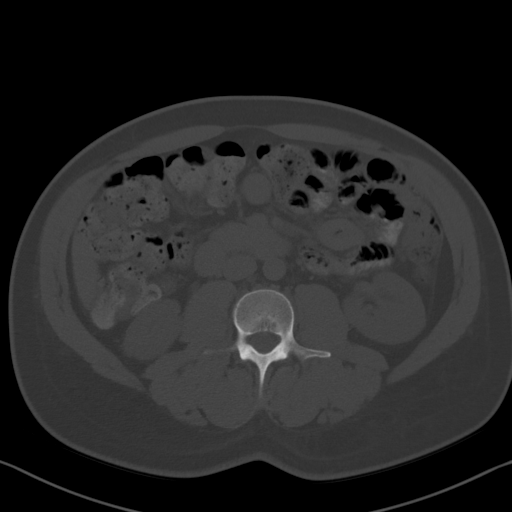
[im 64/91  soft-tissue]
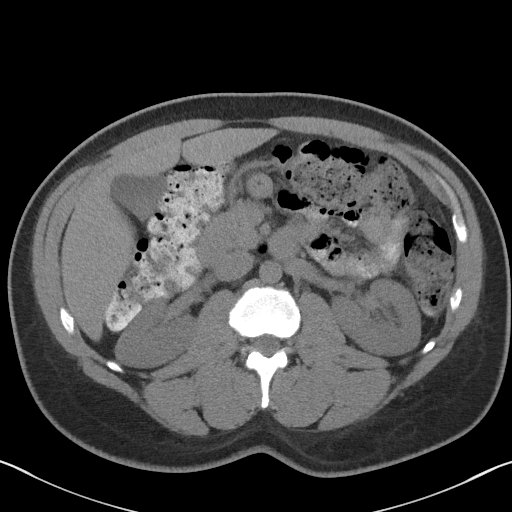
[im 69/91  soft-tissue]
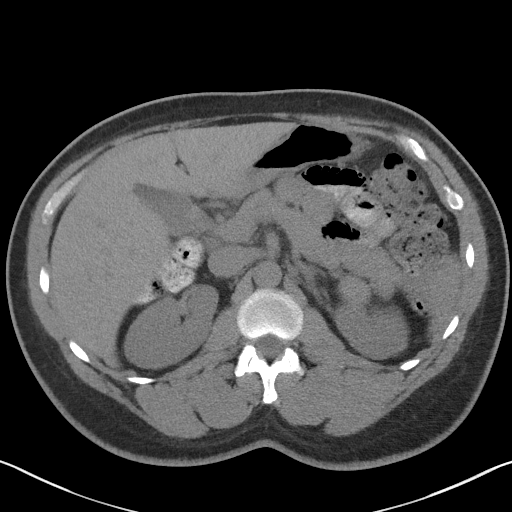
[im 80/91  soft-tissue]
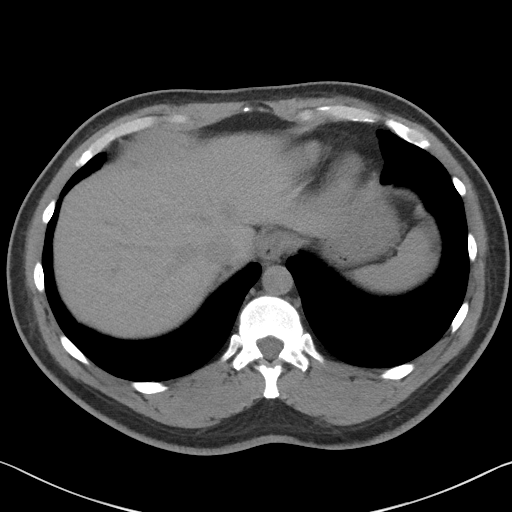
[im 85/91  soft-tissue]
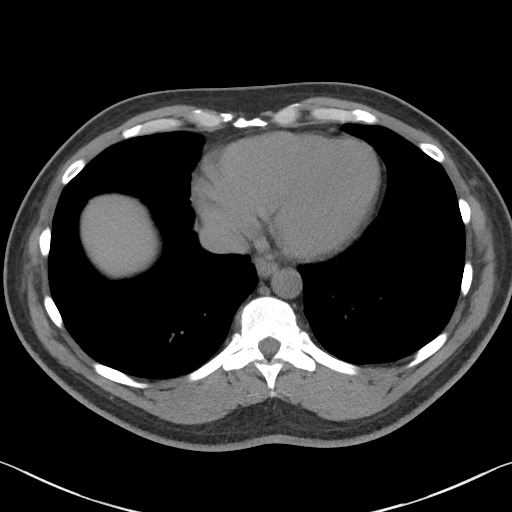

[Series 5: coronal st · coronal · 0.76mm/px · 3 of 84 slices shown]
[im 28/84  soft-tissue]
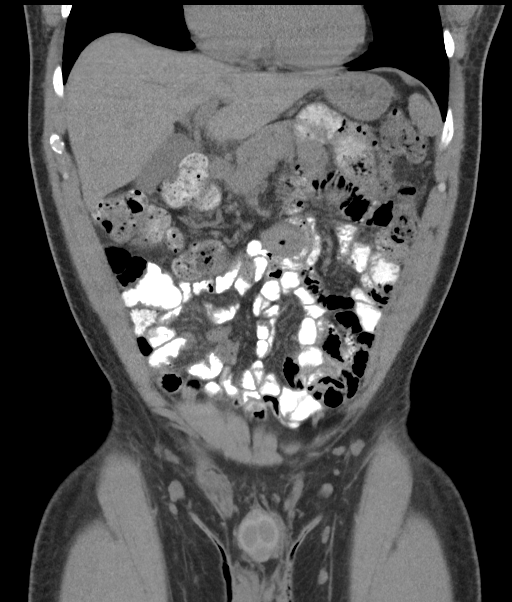
[im 37/84  soft-tissue]
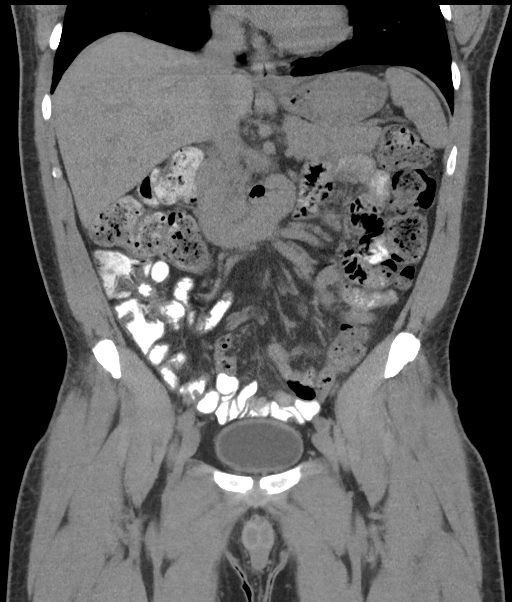
[im 47/84  soft-tissue]
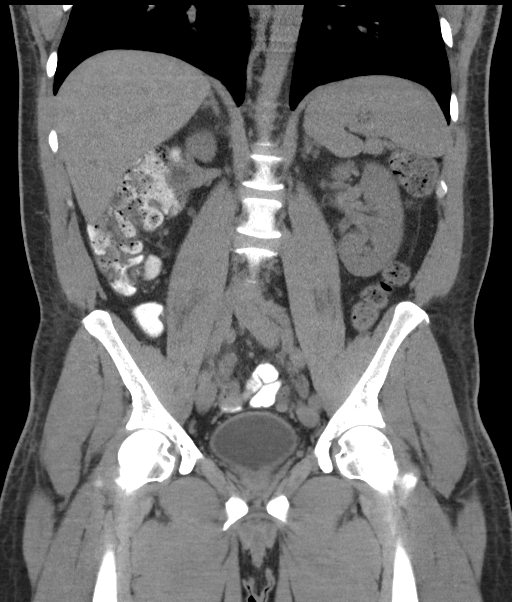

[16 of 46 positions shown; findings below may reference images not displayed]

FINDINGS: Lower chest: No acute abnormality.

Hepatobiliary: No focal liver abnormality is seen. No gallstones,
gallbladder wall thickening, or biliary dilatation.

Pancreas: Unremarkable. No pancreatic ductal dilatation or
surrounding inflammatory changes.

Spleen: Normal in size without focal abnormality.

Adrenals/Urinary Tract: Adrenal glands are unremarkable. Kidneys are
normal, without renal calculi, focal lesion, or hydronephrosis. Mild
bladder wall thickening which may be secondary to underdistention
versus cystitis.

Stomach/Bowel: Stomach is within normal limits. Appendix appears
normal. No evidence of bowel wall thickening, distention, or
inflammatory changes. Moderate amount of stool throughout the colon.

Vascular/Lymphatic: No significant vascular findings are present. No
enlarged abdominal or pelvic lymph nodes.

Reproductive: Prostate is unremarkable.

Other: No abdominal wall hernia or abnormality. No abdominopelvic
ascites.

Musculoskeletal: No acute osseous abnormality. No aggressive osseous
lesion.
IMPRESSION: 1. Mild bladder wall thickening which may be secondary to
underdistention versus cystitis.
2. No urolithiasis or obstructive uropathy.
3. Moderate amount of stool throughout the colon.

## 2020-09-19 ENCOUNTER — Other Ambulatory Visit: Payer: Self-pay

## 2020-09-19 DIAGNOSIS — Z20822 Contact with and (suspected) exposure to covid-19: Secondary | ICD-10-CM

## 2020-09-21 LAB — SARS-COV-2, NAA 2 DAY TAT

## 2020-09-21 LAB — NOVEL CORONAVIRUS, NAA: SARS-CoV-2, NAA: NOT DETECTED

## 2021-01-30 ENCOUNTER — Other Ambulatory Visit: Payer: Self-pay

## 2021-01-30 ENCOUNTER — Telehealth (INDEPENDENT_AMBULATORY_CARE_PROVIDER_SITE_OTHER): Payer: Self-pay | Admitting: Nurse Practitioner

## 2021-01-30 DIAGNOSIS — Z09 Encounter for follow-up examination after completed treatment for conditions other than malignant neoplasm: Secondary | ICD-10-CM

## 2021-01-30 NOTE — Progress Notes (Signed)
   [facility] Patient [facility] [address] [place], [place]  [address] Phone:  [phone]   Fax:  [phone]  Virtual Visit via Video Note  I was unable to connect with [name] on 01/30/21 at  3:00 PM EDT by video a  Barbette Merino, NP

## 2021-02-12 ENCOUNTER — Telehealth: Payer: Self-pay

## 2021-02-12 NOTE — Telephone Encounter (Signed)
Pt said he is asking for GI referral for stomach issues

## 2023-06-19 ENCOUNTER — Encounter: Payer: Self-pay | Admitting: Physician Assistant

## 2023-09-08 ENCOUNTER — Ambulatory Visit: Payer: BLUE CROSS/BLUE SHIELD | Admitting: Physician Assistant

## 2023-09-08 ENCOUNTER — Encounter: Payer: Self-pay | Admitting: Physician Assistant

## 2023-09-09 ENCOUNTER — Ambulatory Visit: Payer: BLUE CROSS/BLUE SHIELD | Admitting: Physician Assistant

## 2023-09-09 NOTE — Progress Notes (Deleted)
09/09/2023 Donald Landry [id number] 09-06-1970  Referring provider: Kallie Locks, FNP Primary GI doctor: {acdocs:[id number]}  ASSESSMENT AND PLAN:   Assessment and Plan              Patient Care Team: Kallie Locks, FNP as PCP - General (Family Medicine)  HISTORY OF PRESENT ILLNESS: 53 y.o. male with a past medical history of morbid obesity, GERD, history of H. pylori, abdominal pain and others listed below presents for evaluation of ***.   03/29/2019 H. pylori breath test positive, sent in [facility] 08/29/2018 1 repeat H. pylori breath test negative 06/10/2023 labs reviewed from primary care at [facility] show unremarkable kidney and liver, no anemia or leukocytosis, normal thyroid 06/15/2023 CT abdomen pelvis with contrast at [facility] for lower abdominal pain showed small right inguinal hernia trace amount of fluid correlate with patient's symptoms, no acute abnormality 08/25/2023 [facility] visit with Dr. Kathlene November, plans on proceeding with surgery.  Discussed the use of AI scribe software for clinical note transcription with the patient, who gave verbal consent to proceed.  History of Present Illness             He  reports that he has never smoked. He has never used smokeless tobacco. He reports current alcohol use. He reports that he does not use drugs.  RELEVANT GI HISTORY, LABS, IMAGING:  CBC    Component Value Date/Time   WBC 6.1 01/25/2019 1016   RBC 4.92 01/25/2019 1016   HGB 16.4 01/25/2019 1016   HCT 46.8 01/25/2019 1016   PLT 250 01/25/2019 1016   MCV 95 01/25/2019 1016   MCH 33.3 (H) 01/25/2019 1016   MCHC 35.0 01/25/2019 1016   RDW 12.3 01/25/2019 1016   LYMPHSABS 1.4 01/25/2019 1016   EOSABS 0.1 01/25/2019 1016   BASOSABS 0.0 01/25/2019 1016   No results for input(s): "HGB" in the last 8760 hours.  CMP     Component Value Date/Time   NA 143 01/25/2019 1016   K 4.3 01/25/2019 1016   CL 105 01/25/2019 1016   CO2 25  01/25/2019 1016   GLUCOSE 97 01/25/2019 1016   BUN 8 01/25/2019 1016   CREATININE 0.98 01/25/2019 1016   CALCIUM 9.1 01/25/2019 1016   PROT 7.1 01/25/2019 1016   ALBUMIN 4.7 01/25/2019 1016   AST 20 01/25/2019 1016   ALT 16 01/25/2019 1016   ALKPHOS 66 01/25/2019 1016   BILITOT 0.5 01/25/2019 1016   GFRNONAA 91 01/25/2019 1016   GFRAA 105 01/25/2019 1016      Latest Ref Rng & Units 01/25/2019   10:16 AM  Hepatic Function  Total Protein 6.0 - 8.5 g/dL 7.1   Albumin 4.0 - 5.0 g/dL 4.7   AST 0 - 40 IU/L 20   ALT 0 - 44 IU/L 16   Alk Phosphatase 39 - 117 IU/L 66   Total Bilirubin 0.0 - 1.2 mg/dL 0.5       Current Medications:        Current Outpatient Medications (Other):    omeprazole (PRILOSEC) 40 MG capsule, Take 40 mg by mouth every morning.  Medical History:  Past Medical History:  Diagnosis Date   Back pain    H. pylori infection 03/2019   Allergies: No Known Allergies   Surgical History:  He  has no past surgical history on file. Family History:  His family history includes Diabetes in his sister and sister; Pancreatic cancer in his paternal grandmother.  REVIEW  OF SYSTEMS  : All other systems reviewed and negative except where noted in the History of Present Illness.  PHYSICAL EXAM: There were no vitals taken for this visit. General Appearance: Well nourished, in no apparent distress. Head:   Normocephalic and atraumatic. Eyes:  sclerae anicteric,conjunctive pink  Respiratory: Respiratory effort normal, BS equal bilaterally without rales, rhonchi, wheezing. Cardio: RRR with no MRGs. Peripheral pulses intact.  Abdomen: Soft,  {BlankSingle:19197::"Flat","Obese","Non-distended"} ,active bowel sounds. {actendernessAB:27319} tenderness {anatomy; site abdomen:5010}. {BlankMultiple:19196::"Without guarding","With guarding","Without rebound","With rebound"}. No masses. Rectal: {acrectalexam:27461} Musculoskeletal: Full ROM, {PSY - GAIT AND STATION:22860} gait.  {With/Without:[id number]} edema. Skin:  Dry and intact without significant lesions or rashes Neuro: Alert and  oriented x4;  No focal deficits. Psych:  Cooperative. Normal mood and affect.    Doree Albee, PA-C 8:23 AM

## 2023-10-27 NOTE — Progress Notes (Signed)
 SUBJECTIVE Donald Landry returns 4 weeks after robotic inguinal hernia repair.  They are doing well.  Pain is controlled on nothing.  Tolerating a regular diet.  They are moving their bowels.  Incision sites well healed.  They are complaining of some firmness at the site, which is improving.  OBJECTIVE Vitals:   10/27/23 0846  BP: 139/72  Pulse: 79  Temp: 97.3 F (36.3 C)  SpO2: 100%   GENERAL: no distress  ABDOMINAL: soft, non-tender, no hernias.  Small right inguinal seroma  INCISION:well healed  SKIN:    ASSESSMENT:  Donald Landry is 4weeks s/p  robotic right inguinal hernia repair and is doing well  PLAN: 1.) Tylenol  and Motrin  as needed for pain 2.) Activity as tolerated 3.) Return to clinic as needed.

## 2023-11-19 NOTE — Telephone Encounter (Signed)
 Attempted to reach pt via phone number listed in chart [phone]) to pre-chart for upcomming video visit appointment on  11/20/2023    . Message was left.  If pt returns call please [place]SF[place] at [phone]
